# Patient Record
Sex: Female | Born: 1955 | Race: Black or African American | Hispanic: No | Smoking: Current every day smoker
Health system: Southern US, Community
[De-identification: ages and names within clinical notes are randomized; demographics above are authoritative.]

## PROBLEM LIST (undated history)

## (undated) DIAGNOSIS — H269 Unspecified cataract: Secondary | ICD-10-CM

## (undated) DIAGNOSIS — K219 Gastro-esophageal reflux disease without esophagitis: Secondary | ICD-10-CM

## (undated) DIAGNOSIS — E785 Hyperlipidemia, unspecified: Secondary | ICD-10-CM

## (undated) HISTORY — PX: CHOLECYSTECTOMY: SHX55

## (undated) HISTORY — PX: TUBAL LIGATION: SHX77

## (undated) HISTORY — PX: EYE SURGERY: SHX253

## (undated) HISTORY — DX: Unspecified cataract: H26.9

## (undated) HISTORY — DX: Hyperlipidemia, unspecified: E78.5

## (undated) HISTORY — DX: Gastro-esophageal reflux disease without esophagitis: K21.9

---

## 1998-05-13 ENCOUNTER — Encounter: Payer: Self-pay | Admitting: Emergency Medicine

## 1998-05-13 ENCOUNTER — Encounter: Payer: Self-pay | Admitting: Gastroenterology

## 1998-05-13 ENCOUNTER — Emergency Department (HOSPITAL_COMMUNITY): Admission: EM | Admit: 1998-05-13 | Discharge: 1998-05-13 | Payer: Self-pay | Admitting: Emergency Medicine

## 1998-05-14 ENCOUNTER — Ambulatory Visit (HOSPITAL_COMMUNITY): Admission: RE | Admit: 1998-05-14 | Discharge: 1998-05-14 | Payer: Self-pay | Admitting: Gastroenterology

## 1998-05-14 ENCOUNTER — Encounter (HOSPITAL_BASED_OUTPATIENT_CLINIC_OR_DEPARTMENT_OTHER): Payer: Self-pay | Admitting: General Surgery

## 1998-05-15 ENCOUNTER — Ambulatory Visit (HOSPITAL_COMMUNITY): Admission: RE | Admit: 1998-05-15 | Discharge: 1998-05-16 | Payer: Self-pay | Admitting: General Surgery

## 1998-05-27 ENCOUNTER — Encounter (HOSPITAL_BASED_OUTPATIENT_CLINIC_OR_DEPARTMENT_OTHER): Payer: Self-pay | Admitting: General Surgery

## 1998-05-27 ENCOUNTER — Emergency Department (HOSPITAL_COMMUNITY): Admission: EM | Admit: 1998-05-27 | Discharge: 1998-05-27 | Payer: Self-pay | Admitting: Emergency Medicine

## 2020-09-16 NOTE — Progress Notes (Addendum)
Subjective:    Rebecca Mata - 65 y.o. female MRN [id number]  Date of birth: 1956/01/25  HPI  Rebecca Mata is to establish care and annual physical exam.   Current issues and/or concerns: 1. SMOKING CESSATION: Smoking Status: current smoker Smoking Amount: currently 10 cigarettes daily Smoking Onset: since 65 years old Smoking Quit Date: Ready to quit. In the past she quit for 6 months abruptly.   Smoking triggers: after eating meal Treatments attempted: Chantix and made her have nightmares  2. DARKENED CIRCLES AROUND EYES: Reports ongoing for several years. Endorses itching and rubbing eyes. Using over-the-counter allergy medications as needed. Vision changing and seeing floaters sometimes. Denies trauma and injury.  3. BUMP ON ABDOMEN: Present for several years. Has not significantly changed in appearance. Denies tenderness, pain, and drainage.Thinks this may be a blackhead. Reports appeared shortly after having gallbladder removed.     ROS per HPI   Health Maintenance:  Health Maintenance Due  Topic Date Due  . COVID-19 Vaccine (1) Never done  . Hepatitis C Screening  Never done  . TETANUS/TDAP  Never done  . PAP SMEAR-Modifier  Never done  . COLONOSCOPY (Pts 45-38yrs Insurance coverage will need to be confirmed)  Never done  . MAMMOGRAM  Never done  . DEXA SCAN  Never done  . PNA vac Low Risk Adult (1 of 2 - PCV13) Never done    Past Medical History: There are no problems to display for this patient.   Social History   reports that she has been smoking cigarettes. She has a 21.00 pack-year smoking history. She has never used smokeless tobacco. She reports previous alcohol use. She reports that she does not use drugs.   Family History  family history includes Brain cancer in her father; Diabetes in her sister; Heart disease in her mother; Stroke in her mother.   Medications: reviewed and updated   Objective:   Physical Exam BP 135/83 (BP Location: Left  Arm, Patient Position: Sitting, Cuff Size: Normal)   Pulse 91   Temp 97.7 F (36.5 C)   Resp 15   Ht 5' 7.48" (1.714 m)   Wt 236 lb 3.2 oz (107.1 kg)   SpO2 96%   BMI 36.47 kg/m  Physical Exam HENT:     Head: Normocephalic and atraumatic.     Right Ear: Tympanic membrane, ear canal and external ear normal.     Left Ear: Tympanic membrane, ear canal and external ear normal.     Nose: Nose normal.     Mouth/Throat:     Mouth: Mucous membranes are moist.     Pharynx: Oropharynx is clear.  Eyes:     Conjunctiva/sclera: Conjunctivae normal.     Pupils: Pupils are equal, round, and reactive to light.  Cardiovascular:     Rate and Rhythm: Normal rate and regular rhythm.     Pulses: Normal pulses.     Heart sounds: Normal heart sounds.  Pulmonary:     Effort: Pulmonary effort is normal.     Breath sounds: Normal breath sounds.  Chest:     Comments: Patient declined examination. Abdominal:     General: Bowel sounds are normal.     Palpations: Abdomen is soft.     Comments: Firm movable non-tender nodule mid-left upper abdomen.  Genitourinary:    Comments: Patient declined examination. Musculoskeletal:        General: Normal range of motion.     Cervical back: Normal range of motion and  neck supple.  Skin:    General: Skin is warm and dry.     Capillary Refill: Capillary refill takes less than 2 seconds.     Comments: Hyperpigmented circles around bilateral eyes.  Neurological:     General: No focal deficit present.     Mental Status: She is alert and oriented to person, place, and time.  Psychiatric:        Mood and Affect: Mood normal.        Behavior: Behavior normal.     Assessment & Plan:  1. Encounter to establish care: - Patient presents today to establish care.   2. Annual physical exam: - Counseled on 150 minutes of exercise per week as tolerated, healthy eating (including decreased daily intake of saturated fats, cholesterol, added sugars, sodium), STI  prevention, and routine healthcare maintenance.  3. Screening for metabolic disorder: - CMP to check kidney function, liver function, and electrolyte balance.  - Comprehensive metabolic panel  4. Screening for deficiency anemia: - CBC to screen for anemia. - CBC  5. Diabetes mellitus screening: 6. Prediabetes: - Hemoglobin A1c today 6.1% and consistent with prediabetes. Next hemoglobin A1c due November 2022.  - Discussed the importance of healthy eating habits, low-carbohydrate diet, low-sugar diet, and regular aerobic exercise (at least 150 minutes a week as tolerated).  - Follow-up with primary provider in 6 months or sooner if needed.  - POCT glycosylated hemoglobin (Hb A1C)  7. Screening cholesterol level: - Lipid panel to screen for high cholesterol.  - Lipid panel  8. Thyroid disorder screen: - TSH to check thyroid function.  - TSH+T4F+T3Free  9. Need for hepatitis C screening test: - Hepatitis C antibody to screen for hepatitis C.  - Hepatitis C Antibody  10. Encounter for screening for HIV: - HIV antibody to screen for human immunodeficiency virus.  - HIV antibody (with reflex)  11. Encounter for screening mammogram for malignant neoplasm of breast: - Referral for breast cancer screening by mammogram.  - MM Digital Screening; Future  12. Pap smear for cervical cancer screening: - Referral to Gynecology for cervical cancer screening by PAP smear.  - Ambulatory referral to Gynecology  13. Colon cancer screening: - Declined colonoscopy.  - Patient prefers Cologuard screening.   14. Osteoporosis screening: - Declined DEXA scan for now.  - Counseled to notify provider when she ready to proceed. Patient agreeable.   15. Encounter for vitamin deficiency screening: - Vitamin D, 25-hydroxy to screen for vitamin D deficiency. - Vitamin D, 25-hydroxy  16. Encounter for smoking cessation counseling: - Tried Chantix in the past and caused nightmares. - Begin  Buproprion as prescribed. Counseled on possible side effects and to notify provider should any occur.  - Follow-up with primary provider in 4 weeks or sooner if needed.  - buPROPion (WELLBUTRIN SR) 150 MG 12 hr tablet; Take 1 tablet (150 mg total) by mouth daily for 3 days, THEN 1 tablet (150 mg total) 2 (two) times daily for 27 days.  Dispense: 57 tablet; Refill: 0  17. Nodule of skin of abdomen: - Chronic and stable. - Counseled to notify provider should new or worsening symptoms occur so that she may be referred at that time. Patient agreeable.   18. Eye exam, routine: - Referral to Ophthalmology for routine eye examination.  - Ambulatory referral to Ophthalmology   Patient was given clear instructions to go to Emergency Department or return to medical center if symptoms don't improve, worsen, or new problems develop.The patient  verbalized understanding.  I discussed the assessment and treatment plan with the patient. The patient was provided an opportunity to ask questions and all were answered. The patient agreed with the plan and demonstrated an understanding of the instructions.   The patient was advised to call back or seek an in-person evaluation if the symptoms worsen or if the condition fails to improve as anticipated.    Ricky Stabs, NP 09/18/2020, 10:37 AM Primary Care at [facility]

## 2020-09-18 ENCOUNTER — Encounter: Payer: Self-pay | Admitting: Family

## 2020-09-18 ENCOUNTER — Ambulatory Visit (INDEPENDENT_AMBULATORY_CARE_PROVIDER_SITE_OTHER): Payer: PPO | Admitting: Family

## 2020-09-18 ENCOUNTER — Other Ambulatory Visit: Payer: Self-pay

## 2020-09-18 VITALS — BP 135/83 | HR 91 | Temp 97.7°F | Resp 15 | Ht 67.48 in | Wt 236.2 lb

## 2020-09-18 DIAGNOSIS — Z114 Encounter for screening for human immunodeficiency virus [HIV]: Secondary | ICD-10-CM | POA: Diagnosis not present

## 2020-09-18 DIAGNOSIS — Z1321 Encounter for screening for nutritional disorder: Secondary | ICD-10-CM

## 2020-09-18 DIAGNOSIS — Z716 Tobacco abuse counseling: Secondary | ICD-10-CM | POA: Diagnosis not present

## 2020-09-18 DIAGNOSIS — R7303 Prediabetes: Secondary | ICD-10-CM | POA: Insufficient documentation

## 2020-09-18 DIAGNOSIS — Z0001 Encounter for general adult medical examination with abnormal findings: Secondary | ICD-10-CM | POA: Diagnosis not present

## 2020-09-18 DIAGNOSIS — Z1329 Encounter for screening for other suspected endocrine disorder: Secondary | ICD-10-CM

## 2020-09-18 DIAGNOSIS — Z7689 Persons encountering health services in other specified circumstances: Secondary | ICD-10-CM

## 2020-09-18 DIAGNOSIS — Z1159 Encounter for screening for other viral diseases: Secondary | ICD-10-CM | POA: Diagnosis not present

## 2020-09-18 DIAGNOSIS — Z0000 Encounter for general adult medical examination without abnormal findings: Secondary | ICD-10-CM

## 2020-09-18 DIAGNOSIS — Z1211 Encounter for screening for malignant neoplasm of colon: Secondary | ICD-10-CM

## 2020-09-18 DIAGNOSIS — Z13228 Encounter for screening for other metabolic disorders: Secondary | ICD-10-CM | POA: Diagnosis not present

## 2020-09-18 DIAGNOSIS — Z13220 Encounter for screening for lipoid disorders: Secondary | ICD-10-CM

## 2020-09-18 DIAGNOSIS — Z0100 Encounter for examination of eyes and vision without abnormal findings: Secondary | ICD-10-CM

## 2020-09-18 DIAGNOSIS — Z130 Encounter for screening for diseases of the blood and blood-forming organs and certain disorders involving the immune mechanism: Secondary | ICD-10-CM

## 2020-09-18 DIAGNOSIS — R222 Localized swelling, mass and lump, trunk: Secondary | ICD-10-CM

## 2020-09-18 DIAGNOSIS — Z124 Encounter for screening for malignant neoplasm of cervix: Secondary | ICD-10-CM

## 2020-09-18 DIAGNOSIS — Z1231 Encounter for screening mammogram for malignant neoplasm of breast: Secondary | ICD-10-CM

## 2020-09-18 DIAGNOSIS — Z131 Encounter for screening for diabetes mellitus: Secondary | ICD-10-CM

## 2020-09-18 DIAGNOSIS — Z13820 Encounter for screening for osteoporosis: Secondary | ICD-10-CM

## 2020-09-18 LAB — POCT GLYCOSYLATED HEMOGLOBIN (HGB A1C): Hemoglobin A1C: 6.1 % — AB (ref 4.0–5.6)

## 2020-09-18 MED ORDER — BUPROPION HCL ER (SR) 150 MG PO TB12: ORAL_TABLET | ORAL | 0 refills | Status: DC

## 2020-09-18 NOTE — Patient Instructions (Signed)
- Return for annual physical examination, labs, and health maintenance. Arrive fasting meaning having no food for at least 8 hours prior to appointment. You may have only water or black coffee. Please take scheduled medications as normal. Thank you for choosing Primary Care at [facility] for your medical home!    Rebecca Mata was seen by Rema Fendt, NP today.   Rebecca Mata's primary care provider is Rema Fendt, NP.   For the best care possible,  you should try to see Ricky Stabs, NP whenever you come to clinic.   We look forward to seeing you again soon!  If you have any questions about your visit today,  please call us at [phone]  Or feel free to reach your provider via MyChart.    Keeping you healthy   Get these tests  Blood pressure- Have your blood pressure checked once a year by your healthcare provider.  Normal blood pressure is 120/80.  Weight- Have your body mass index (BMI) calculated to screen for obesity.  BMI is a measure of body fat based on height and weight. You can also calculate your own BMI at https://www.west-esparza.com/.  Cholesterol- Have your cholesterol checked regularly starting at age 48, sooner may be necessary if you have diabetes, high blood pressure, if a family member developed heart diseases at an early age or if you smoke.   Chlamydia, HIV, and other sexual transmitted disease- Get screened each year until the age of 39 then within three months of each new sexual partner.  Diabetes- Have your blood sugar checked regularly if you have high blood pressure, high cholesterol, a family history of diabetes or if you are overweight.   Get these vaccines  Flu shot- Every fall.  Tetanus shot- Every 10 years.  Menactra- Single dose; prevents meningitis.   Take these steps  Don't smoke- If you do smoke, ask your healthcare provider about quitting. For tips on how to quit, go to www.smokefree.gov or call 1-800-QUIT-NOW.  Be  physically active- Exercise 5 days a week for at least 30 minutes.  If you are not already physically active start slow and gradually work up to 30 minutes of moderate physical activity.  Examples of moderate activity include walking briskly, mowing the yard, dancing, swimming bicycling, etc.  Eat a healthy diet- Eat a variety of healthy foods such as fruits, vegetables, low fat milk, low fat cheese, yogurt, lean meats, poultry, fish, beans, tofu, etc.  For more information on healthy eating, go to www.thenutritionsource.org  Drink alcohol in moderation- Limit alcohol intake two drinks or less a day.  Never drink and drive.  Dentist- Brush and floss teeth twice daily; visit your dentis twice a year.  Depression-Your emotional health is as important as your physical health.  If you're feeling down, losing interest in things you normally enjoy please talk with your healthcare provider.  Gun Safety- If you keep a gun in your home, keep it unloaded and with the safety lock on.  Bullets should be stored separately.  Helmet use- Always wear a helmet when riding a motorcycle, bicycle, rollerblading or skateboarding.  Safe sex- If you may be exposed to a sexually transmitted infection, use a condom  Seat belts- Seat bels can save your life; always wear one.  Smoke/Carbon Monoxide detectors- These detectors need to be installed on the appropriate level of your home.  Replace batteries at least once a year.  Skin Cancer- When out in the sun, cover up  and use sunscreen SPF 15 or higher.  Violence- If anyone is threatening or hurting you, please tell your healthcare provider.

## 2020-09-18 NOTE — Progress Notes (Signed)
Establish care Has not been seen by PCP for over 15-16 years

## 2020-09-19 LAB — COMPREHENSIVE METABOLIC PANEL
ALT: 19 IU/L (ref 0–32)
AST: 42 IU/L — ABNORMAL HIGH (ref 0–40)
Albumin/Globulin Ratio: 1.4 (ref 1.2–2.2)
Albumin: 4.6 g/dL (ref 3.8–4.8)
Alkaline Phosphatase: 78 IU/L (ref 44–121)
BUN/Creatinine Ratio: 17 (ref 12–28)
BUN: 16 mg/dL (ref 8–27)
Bilirubin Total: 0.4 mg/dL (ref 0.0–1.2)
CO2: 21 mmol/L (ref 20–29)
Calcium: 9.8 mg/dL (ref 8.7–10.3)
Chloride: 100 mmol/L (ref 96–106)
Creatinine, Ser: 0.92 mg/dL (ref 0.57–1.00)
Globulin, Total: 3.4 g/dL (ref 1.5–4.5)
Glucose: 91 mg/dL (ref 65–99)
Potassium: 4.6 mmol/L (ref 3.5–5.2)
Sodium: 139 mmol/L (ref 134–144)
Total Protein: 8 g/dL (ref 6.0–8.5)
eGFR: 69 mL/min/{1.73_m2} (ref >59)

## 2020-09-19 LAB — LIPID PANEL
Chol/HDL Ratio: 5.9 ratio — ABNORMAL HIGH (ref 0.0–4.4)
Cholesterol, Total: 218 mg/dL — ABNORMAL HIGH (ref 100–199)
HDL: 37 mg/dL — ABNORMAL LOW (ref >39)
LDL Chol Calc (NIH): 154 mg/dL — ABNORMAL HIGH (ref 0–99)
Triglycerides: 147 mg/dL (ref 0–149)
VLDL Cholesterol Cal: 27 mg/dL (ref 5–40)

## 2020-09-19 LAB — HIV ANTIBODY (ROUTINE TESTING W REFLEX): HIV Screen 4th Generation wRfx: NONREACTIVE

## 2020-09-19 LAB — CBC
Hematocrit: 42.2 % (ref 34.0–46.6)
Hemoglobin: 13.2 g/dL (ref 11.1–15.9)
MCH: 27.2 pg (ref 26.6–33.0)
MCHC: 31.3 g/dL — ABNORMAL LOW (ref 31.5–35.7)
MCV: 87 fL (ref 79–97)
Platelets: 312 10*3/uL (ref 150–450)
RBC: 4.85 x10E6/uL (ref 3.77–5.28)
RDW: 15 % (ref 11.7–15.4)
WBC: 7 10*3/uL (ref 3.4–10.8)

## 2020-09-19 LAB — TSH+T4F+T3FREE
Free T4: 1.2 ng/dL (ref 0.82–1.77)
T3, Free: 3.1 pg/mL (ref 2.0–4.4)
TSH: 0.763 u[IU]/mL (ref 0.450–4.500)

## 2020-09-19 LAB — VITAMIN D 25 HYDROXY (VIT D DEFICIENCY, FRACTURES): Vit D, 25-Hydroxy: 11.6 ng/mL — ABNORMAL LOW (ref 30.0–100.0)

## 2020-09-19 LAB — HEPATITIS C ANTIBODY: Hep C Virus Ab: <0.1 s/co ratio (ref 0.0–0.9)

## 2020-09-20 ENCOUNTER — Other Ambulatory Visit: Payer: Self-pay | Admitting: Family

## 2020-09-20 ENCOUNTER — Encounter: Payer: Self-pay | Admitting: Family

## 2020-09-20 DIAGNOSIS — E785 Hyperlipidemia, unspecified: Secondary | ICD-10-CM

## 2020-09-20 DIAGNOSIS — E559 Vitamin D deficiency, unspecified: Secondary | ICD-10-CM | POA: Insufficient documentation

## 2020-09-20 MED ORDER — ATORVASTATIN CALCIUM 40 MG PO TABS: 40.0000 mg | ORAL_TABLET | Freq: Every day | ORAL | 0 refills | Status: AC

## 2020-09-20 MED ORDER — CHOLECALCIFEROL 1.25 MG (50000 UT) PO TABS: 1.0000 | ORAL_TABLET | ORAL | 0 refills | Status: AC

## 2020-09-20 NOTE — Progress Notes (Signed)
Kidney function normal.   Thyroid function normal.  Hepatitis C negative.   HIV negative.  AST elevated. This is used to check liver function.   Some causes of an elevation of AST may be increased alcohol consumption, high-fat diet, or overuse of NSAID's such as Ibuprofen, Aleve, Motrin, and Naproxen. Patient encouraged to have rechecked in 4 weeks for lab only appointment.  CBC to screen for anemia essentially normal. Reminder to incorporate  iron-rich foods such as dark green leafy vegetables and dried fruit such as raisins and apricots in diet.  Vitamin D level low. Cholecalciferol (Vitamin D3) prescribed. Take 1 tablet every 7 days. Patient encouraged to have rechecked in 12 weeks.  Hemoglobin A1c is consistent with pre-diabetes. Practice healthy eating habits of fresh fruit and vegetables, lean baked meats such as chicken, fish, and [place]; limit breads, rice, pastas, and desserts; practice regular aerobic exercise (at least 150 minutes a week as tolerated). Patient encouraged to have rechecked in 3 to 6 months.  Cholesterol higher than expected. High cholesterol may increase risk of heart attack and/or stroke. Consider eating more fruits, vegetables, and lean baked meats such as chicken or fish. Moderate intensity exercise at least 150 minutes as tolerated per week may help as well.   Begin Atorvastatin 1 tablet daily for high cholesterol. Patient encouraged to have rechecked in 12 weeks.  The following is for provider reference only: The 10-year ASCVD risk score is: 20%   Values used to calculate the score:     Age: 65 years     Sex: Female     Is Non-Hispanic African American: Yes     Diabetic: No     Tobacco smoker: Yes     Systolic Blood Pressure: 135 mmHg     Is BP treated: No     HDL Cholesterol: 37 mg/dL     Total Cholesterol: 218 mg/dL

## 2020-10-03 DIAGNOSIS — Z1212 Encounter for screening for malignant neoplasm of rectum: Secondary | ICD-10-CM | POA: Diagnosis not present

## 2020-10-03 DIAGNOSIS — Z1211 Encounter for screening for malignant neoplasm of colon: Secondary | ICD-10-CM | POA: Diagnosis not present

## 2020-10-05 LAB — COLOGUARD: Cologuard: POSITIVE — AB

## 2020-10-12 ENCOUNTER — Other Ambulatory Visit: Payer: Self-pay | Admitting: Family

## 2020-10-12 DIAGNOSIS — Z716 Tobacco abuse counseling: Secondary | ICD-10-CM

## 2020-10-14 ENCOUNTER — Encounter: Payer: Self-pay | Admitting: Family

## 2020-10-14 NOTE — Progress Notes (Unsigned)
[id number] 

## 2020-10-15 NOTE — Progress Notes (Signed)
Patient ID: Rebecca Mata, female    DOB: 02/03/1956  MRN: [id number]  CC: Smoking Cessation Follow-Up  Subjective: Rebecca Mata is a 65 y.o. female who presents for smoking cessation follow-up.   Her concerns today include:   SMOKING CESSATION FOLLOW-UP: 09/18/2020: - Tried Chantix in the past and caused nightmares. - Begin Buproprion as prescribed. Counseled on possible side effects and to notify provider should any occur.  - Follow-up with primary provider in 4 weeks or sooner if needed.   10/16/2020: Doing well on current regimen. Down to 2 or 3 cigarettes daily. Most times 1 cigarette can last 2 days. Reports having a distaste for cigarettes sometimes when smoking.   2. COLOGUARD FOLLOW-UP: Has concerns about positive Cologuard screening.   3. OSTEOPOROSIS SCREENING: Reports she is ready to proceed with DEXA SCAN.    Patient Active Problem List   Diagnosis Date Noted   Vitamin D deficiency 09/20/2020   Hyperlipidemia 09/20/2020   Prediabetes 09/18/2020     Current Outpatient Medications on File Prior to Visit  Medication Sig Dispense Refill   atorvastatin (LIPITOR) 40 MG tablet Take 1 tablet (40 mg total) by mouth daily. 90 tablet 0   Cholecalciferol (VITAMIN D3) 125 MCG (5000 UT) CAPS TAKE 1 TABLET BY MOUTH EVERY 7 (SEVEN) DAYS FOR 12 DOSES.     Cholecalciferol 1.25 MG (50000 UT) TABS Take 1 tablet by mouth every 7 (seven) days for 12 doses. 12 tablet 0   No current facility-administered medications on file prior to visit.    No Known Allergies  Social History   Socioeconomic History   Marital status: Widowed    Spouse name: Not on file   Number of children: Not on file   Years of education: Not on file   Highest education level: Not on file  Occupational History   Not on file  Tobacco Use   Smoking status: Every Day    Packs/day: 0.50    Years: 42.00    Pack years: 21.00    Types: Cigarettes   Smokeless tobacco: Never  Vaping Use   Vaping Use:  Never used  Substance and Sexual Activity   Alcohol use: Not Currently    Alcohol/week: 0.0 standard drinks   Drug use: Never   Sexual activity: Not Currently    Birth control/protection: Post-menopausal  Other Topics Concern   Not on file  Social History Narrative   Not on file   Social Determinants of Health   Financial Resource Strain: Not on file  Food Insecurity: Not on file  Transportation Needs: Not on file  Physical Activity: Not on file  Stress: Not on file  Social Connections: Not on file  Intimate Partner Violence: Not on file    Family History  Problem Relation Age of Onset   Heart disease Mother    Stroke Mother    Brain cancer Father    Diabetes Sister     Past Surgical History:  Procedure Laterality Date   CHOLECYSTECTOMY     TUBAL LIGATION      ROS: Review of Systems Negative except as stated above  PHYSICAL EXAM: BP 139/74 (BP Location: Left Arm, Patient Position: Sitting, Cuff Size: Normal)   Pulse 71   Temp 98.1 F (36.7 C)   Resp 15   Ht 5' 7.48" (1.714 m)   Wt 229 lb 9.6 oz (104.1 kg)   SpO2 98%   BMI 35.45 kg/m   Wt Readings from Last 3 Encounters:  10/16/20 229 lb 9.6 oz (104.1 kg)  09/18/20 236 lb 3.2 oz (107.1 kg)    Physical Exam General appearance - alert, well appearing, and in no distress and oriented to person, place, and time Mental status - alert, oriented to person, place, and time, normal mood, behavior, speech, dress, motor activity, and thought processes Neck - supple, no significant adenopathy Chest - clear to auscultation, no wheezes, rales or rhonchi, symmetric air entry, no tachypnea, retractions or cyanosis Heart - normal rate, regular rhythm, normal S1, S2, no murmurs, rubs, clicks or gallops Neurological - alert, oriented, normal speech, no focal findings or movement disorder noted  ASSESSMENT AND PLAN: 1. Encounter for smoking cessation counseling: - Continue Bupropion as prescribed.  - Follow-up with  primary provider in 3 months or sooner if needed.  - buPROPion (WELLBUTRIN SR) 150 MG 12 hr tablet; Take 1 tablet (150 mg total) by mouth twice (2 times) daily.  Dispense: 60 tablet; Refill: 2  2. Colon cancer screening: - Cologuard positive on 10/05/2020. - Referral to Gastroenterology for further evaluation and management.  - Ambulatory referral to Gastroenterology  3. Osteoporosis screening: - DG Bone Density to screen for osteoporosis.  - DG Bone Density; Future   Patient was given the opportunity to ask questions.  Patient verbalized understanding of the plan and was able to repeat key elements of the plan. Patient was given clear instructions to go to Emergency Department or return to medical center if symptoms don't improve, worsen, or new problems develop.The patient verbalized understanding.   Orders Placed This Encounter  Procedures   DG Bone Density   Ambulatory referral to Gastroenterology    Requested Prescriptions   Signed Prescriptions Disp Refills   buPROPion (WELLBUTRIN SR) 150 MG 12 hr tablet 60 tablet 2    Sig: Take 1 tablet (150 mg total) by mouth twice (2 times) daily.   Follow-up with primary provider in 3 months or sooner if needed.    Rema Fendt, NP

## 2020-10-16 ENCOUNTER — Encounter: Payer: Self-pay | Admitting: Family

## 2020-10-16 ENCOUNTER — Ambulatory Visit (INDEPENDENT_AMBULATORY_CARE_PROVIDER_SITE_OTHER): Payer: PPO | Admitting: Family

## 2020-10-16 ENCOUNTER — Other Ambulatory Visit: Payer: Self-pay

## 2020-10-16 VITALS — BP 139/74 | HR 71 | Temp 98.1°F | Resp 15 | Ht 67.48 in | Wt 229.6 lb

## 2020-10-16 DIAGNOSIS — Z1211 Encounter for screening for malignant neoplasm of colon: Secondary | ICD-10-CM

## 2020-10-16 DIAGNOSIS — Z13820 Encounter for screening for osteoporosis: Secondary | ICD-10-CM | POA: Diagnosis not present

## 2020-10-16 DIAGNOSIS — Z716 Tobacco abuse counseling: Secondary | ICD-10-CM

## 2020-10-16 MED ORDER — BUPROPION HCL ER (SR) 150 MG PO TB12: ORAL_TABLET | ORAL | 2 refills | Status: DC

## 2020-10-16 NOTE — Progress Notes (Signed)
Pt presents for smoking cessation f/u Pt req referral for colonoscopy, positive for Cologuard

## 2020-10-16 NOTE — Patient Instructions (Signed)
Colonoscopy, Adult A colonoscopy is a procedure to look at the entire large intestine. This procedure is done using a long, thin, flexible tube that has a camera on theend. You may have a colonoscopy: As a part of normal colorectal screening. If you have certain symptoms, such as: A low number of red blood cells in your blood (anemia). Diarrhea that does not go away. Pain in your abdomen. Blood in your stool. A colonoscopy can help screen for and diagnose medical problems, including: Tumors. Extra tissue that grows where mucus forms (polyps). Inflammation. Areas of bleeding. Tell your health care provider about: Any allergies you have. All medicines you are taking, including vitamins, herbs, eye drops, creams, and over-the-counter medicines. Any problems you or family members have had with anesthetic medicines. Any blood disorders you have. Any surgeries you have had. Any medical conditions you have. Any problems you have had with having bowel movements. Whether you are pregnant or may be pregnant. What are the risks? Generally, this is a safe procedure. However, problems may occur, including: Bleeding. Damage to your intestine. Allergic reactions to medicines given during the procedure. Infection. This is rare. What happens before the procedure? Eating and drinking restrictions Follow instructions from your health care provider about eating or drinking restrictions, which may include: A few days before the procedure: Follow a low-fiber diet. Avoid nuts, seeds, dried fruit, raw fruits, and vegetables. 1-3 days before the procedure: Eat only gelatin dessert or ice pops. Drink only clear liquids, such as water, clear juice, clear broth or bouillon, black coffee or tea, or clear soft drinks or sports drinks. Avoid liquids that contain red or purple dye. The day of the procedure: Do not eat solid foods. You may continue to drink clear liquids until up to 2 hours before the  procedure. Do not eat or drink anything starting 2 hours before the procedure, or within the time period that your health care provider recommends. Bowel prep If you were prescribed a bowel prep to take by mouth (orally) to clean out your colon: Take it as told by your health care provider. Starting the day before your procedure, you will need to drink a large amount of liquid medicine. The liquid will cause you to have many bowel movements of loose stool until your stool becomes almost clear or light green. If your skin or the opening between the buttocks (anus) gets irritated from diarrhea, you may relieve the irritation using: Wipes with medicine in them, such as adult wet wipes with aloe and vitamin E. A product to soothe skin, such as petroleum jelly. If you vomit while drinking the bowel prep: Take a break for up to 60 minutes. Begin the bowel prep again. Call your health care provider if you keep vomiting or you cannot take the bowel prep without vomiting. To clean out your colon, you may also be given: Laxative medicines. These help you have a bowel movement. Instructions for enema use. An enema is liquid medicine injected into your rectum. Medicines Ask your health care provider about: Changing or stopping your regular medicines or supplements. This is especially important if you are taking iron supplements, diabetes medicines, or blood thinners. Taking medicines such as aspirin and ibuprofen. These medicines can thin your blood. Do not take these medicines unless your health care provider tells you to take them. Taking over-the-counter medicines, vitamins, herbs, and supplements. General instructions Ask your health care provider what steps will be taken to help prevent infection. These may include washing   skin with a germ-killing soap. Plan to have someone take you home from the hospital or clinic. What happens during the procedure?  An IV will be inserted into one of your  veins. You may be given one or more of the following: A medicine to help you relax (sedative). A medicine to numb the area (local anesthetic). A medicine to make you fall asleep (general anesthetic). This is rarely needed. You will lie on your side with your knees bent. The tube will: Have oil or gel put on it (be lubricated). Be inserted into your anus. Be gently eased through all parts of your large intestine. Air will be sent into your colon to keep it open. This may cause some pressure or cramping. Images will be taken with the camera and will appear on a screen. A small tissue sample may be removed to be looked at under a microscope (biopsy). The tissue may be sent to a lab for testing if any signs of problems are found. If small polyps are found, they may be removed and checked for cancer cells. When the procedure is finished, the tube will be removed. The procedure may vary among health care providers and hospitals. What happens after the procedure? Your blood pressure, heart rate, breathing rate, and blood oxygen level will be monitored until you leave the hospital or clinic. You may have a small amount of blood in your stool. You may pass gas and have mild cramping or bloating in your abdomen. This is caused by the air that was used to open your colon during the exam. Do not drive for 24 hours after the procedure. It is up to you to get the results of your procedure. Ask your health care provider, or the department that is doing the procedure, when your results will be ready. Summary A colonoscopy is a procedure to look at the entire large intestine. Follow instructions from your health care provider about eating and drinking before the procedure. If you were prescribed an oral bowel prep to clean out your colon, take it as told by your health care provider. During the colonoscopy, a flexible tube with a camera on its end is inserted into the anus and then passed into the other  parts of the large intestine. This information is not intended to replace advice given to you by your health care provider. Make sure you discuss any questions you have with your healthcare provider. Document Revised: 11/16/2018 Document Reviewed: 11/16/2018 Elsevier Patient Education  2022 [employer]  

## 2020-11-05 ENCOUNTER — Other Ambulatory Visit: Payer: Self-pay

## 2020-11-05 ENCOUNTER — Ambulatory Visit
Admission: RE | Admit: 2020-11-05 | Discharge: 2020-11-05 | Disposition: A | Discharge status: Home / Self Care | Payer: PPO | Source: Ambulatory Visit | Attending: Family | Admitting: Family

## 2020-11-05 DIAGNOSIS — Z780 Asymptomatic menopausal state: Secondary | ICD-10-CM | POA: Diagnosis not present

## 2020-11-05 DIAGNOSIS — Z13820 Encounter for screening for osteoporosis: Secondary | ICD-10-CM

## 2020-11-06 NOTE — Progress Notes (Signed)
No osteoporosis.

## 2020-11-06 NOTE — Progress Notes (Signed)
Repeat bone density in 1 to 2 years.

## 2020-11-20 DIAGNOSIS — H40033 Anatomical narrow angle, bilateral: Secondary | ICD-10-CM | POA: Diagnosis not present

## 2020-11-20 DIAGNOSIS — H2511 Age-related nuclear cataract, right eye: Secondary | ICD-10-CM | POA: Diagnosis not present

## 2020-11-20 DIAGNOSIS — R7309 Other abnormal glucose: Secondary | ICD-10-CM | POA: Diagnosis not present

## 2020-11-26 ENCOUNTER — Ambulatory Visit
Admission: RE | Admit: 2020-11-26 | Discharge: 2020-11-26 | Disposition: A | Discharge status: Home / Self Care | Payer: PPO | Source: Ambulatory Visit | Attending: Family | Admitting: Family

## 2020-11-26 ENCOUNTER — Other Ambulatory Visit: Payer: Self-pay

## 2020-11-26 DIAGNOSIS — Z1231 Encounter for screening mammogram for malignant neoplasm of breast: Secondary | ICD-10-CM | POA: Diagnosis not present

## 2020-11-30 NOTE — Progress Notes (Signed)
No evidence of malignancy. Repeat mammogram in 1 year.

## 2020-12-14 ENCOUNTER — Other Ambulatory Visit: Payer: Self-pay

## 2020-12-14 NOTE — Patient Outreach (Signed)
Aging Gracefully Program  12/14/2020  Rebecca Mata April 14, 1956 [id number]   [facility] Evaluation Interviewer made contact with patient. Aging Gracefully survey scheduled for 12/18/20 at  10 o'clock.   [facility] Care Management Assistant  [phone]

## 2020-12-18 ENCOUNTER — Other Ambulatory Visit: Payer: Self-pay

## 2020-12-18 NOTE — Patient Outreach (Signed)
Aging Gracefully Program  12/18/2020  Rebecca Mata December 30, 1955 [id number]  [facility] Evaluation Interviewer made contact with patient. Aging Gracefully survey completed.   Interviewer will send referral to [place] [facility] and OT for follow up.  [facility] Care Management Assistant  [phone]

## 2020-12-21 ENCOUNTER — Ambulatory Visit: Payer: PPO | Admitting: Family

## 2020-12-22 ENCOUNTER — Other Ambulatory Visit: Payer: Self-pay | Admitting: Family

## 2021-01-05 ENCOUNTER — Other Ambulatory Visit: Payer: Self-pay

## 2021-01-05 ENCOUNTER — Other Ambulatory Visit: Payer: Self-pay | Admitting: Occupational Therapy

## 2021-01-05 NOTE — Patient Instructions (Signed)
Goals:   Goals Addressed                       This Visit's Progress     Patient Stated            Feel safe at her front and back entrances to her house. Front entrance needs railing to be secured at steps and on porch. Back entrance needs a ramp so she can have a level entrance (no steps) to get into her house.       Patient Stated            She would like to feel more safe in her bathrooms. Hall bath needs a grab bar for shower, hand held shower, shower seat, and a comfort height toilet. Master bath needs grab bars in shower stall.

## 2021-01-05 NOTE — Patient Outreach (Signed)
Aging Gracefully Program  OT Initial Visit  01/05/2021  Rebecca Mata 1955-08-17 [id number]  Visit:  1- Initial Visit  Start Time:  1300 End Time:  1415 Total Minutes:  75  CCAP: Typical Daily Routine: Typical Daily Routine:: Gets up, does some tasks around the house, runs errands, goes to [employer] program 3-4 days a week. What Types Of Care Problems Are You Having Throughout The Day?: back pain and stiffness but has gotten somewhat better since she has lost 20 pounds with hoping to lose 20 more What Kind Of Help Do You Receive?: Grandson helps some around the house, she pays to have someone mow her yard Do You Think You Need Other Types Of Help?: Not physical help, but help with home repairs What Do You Think Would Make Everyday Life Easier For You?: fixing railing at front door, ramp at back door, grab bars in bathrooms, comfort height toilet in hall bathroom What Is A Good Day Like?: Less pain/stiffness What Is A Bad Day Like?: More pain/stiffness Do You Have Time For Yourself?: yes Patient Reported Equipment: Patient Reported Equipment Currently Used:  (folding chair in tub/shower and at bathroom sink, stool in kitchen) Functional Mobility-Stooping, Crouching, Kneeling To Retreive Item: Stooping, Crouching, or Kneeling To Retrieve Item: Moderate Difficulty Do You:: Use A Device (reacher) Functional Mobility-Climb 1 Flight Of Stairs: Climb 1 Flight Of Stairs: Moderate Difficulty (depending on rail or ramp v stairs) Do You:: No Device/No Assistance Importance Of Learning New Strategies:: Very Much Observation: Climb One Flight Of Stairs: Independent With Pain, Difficulty, Or Use Of Device Safety: Moderate/Extreme Risk Efficiency: Somewhat Intervention: Yes Other Comments:: railing fixed at front , ramp at back Functional Mobility-Move In And Out Of Bath/Shower: Move In And Out Of A Bath/Shower: A Little Difficulty Do You:: Use A Device Importance Of Learning New  Strategies:: Moderate Safety: Moderate/Extreme Risk Efficiency: Somewhat Intervention: Yes Other Comments:: grab bars and shower seat Functional Mobility-Get On And Off Toilet: Getting Up From The Floor: A Little Difficulty  Readiness To Change Score:  Readiness to Change Score: 10  Home Environment Assessment: Outside Home Entry:: Front door--metal rail up stairs and on her porch is loose. She does not have a level entry into her house at present (she had one many years ago--a ramp when she was taking care ofher father, but it rotted) Surveyor, mining:: Faucet leaks Bathroom:: Master bath--needs grab bars in shower (one on inside wall between seats and one on wall on outside to left. Hall bath--needs a grab bar on outside at front of tub (where faucets are), a hande held shower, a comfort height toilet, and faucet/water controls looked at for leaking. Laundry:: Had a leak, she cut out sheetrock to find it and sheetrock has never been put back due to is afraid it may happen again--wonders if the sheetrock can be put back but with an access door to the pipes Other Home Environment Concerns:: A light in the living room trips the power when she has it plugged in and turns it on, 2 outlets in kitchen don't work, window where she installed a cooling window unit is broken (broke while they were putting in window unit), she doesnt feel that window unit is set a correct angle due to it is causing mildew on the house, she runs out of hot water if she and grandson take showers back to back, mold at [place] door, moisture under the house, dryer vent disconnected under house, ceiling fan in her bedroom wobles, would  like ceiling fans in master bedroom, board missing at rear of house at pitch of roof, fascia board at several places on house needs repair, would like gutters put back up, would like roof looked at (30+ years), would like a divided screen door at back door   Goals:  Goals Addressed             This  Visit's Progress    Patient Stated       Feel safe at her front and back entrances to her house. Front entrance needs railing to be secured at steps and on porch. Back entrance needs a ramp so she can have a level entrance (no steps) to get into her house.     Patient Stated       She would like to feel more safe in her bathrooms. Hall bath needs a grab bar for shower, hand held shower, shower seat, and a comfort height toilet. Master bath needs grab bars in shower stall.        Post Clinical Reasoning: Clinician View Of Client Situation:: Rebecca Mata has been working towards losing weight since her family members have started having more health issues at the age she is now, She has lost 20 pounds so far 20 pounds so far and is working towards losing 20 more. She is a very independent lady who has been a single mom of 2 children since she was 66 years old. She does all of her basic ADLs and IADLs by herself with some assistance from her grandson (a Holiday representative at [employer]) that lives with her. There are several safety issues that need to be taken care of to make getting into her home more safe now and in the future, bathroom modifications to make them more safe, and other home concerns she has to be able to stay there for as long as she can. Client View Of His/Her Situation:: Rebecca Mata is very happy with the changes she has made to help her become more healthy and losing the 20 pounds she has lost so far. She takes good care of her house inside and out, trying to do as much as she can for herself over the years. She looks forward to what the Aging Gracefully program can offer her. Next Visit Plan:: Shower seat  [name], OTR/L Aging Gracefully [phone]

## 2021-01-08 ENCOUNTER — Other Ambulatory Visit: Payer: Self-pay

## 2021-01-08 NOTE — Patient Outreach (Signed)
Aging Gracefully Program  01/08/2021  Rebecca Mata [birth year] [id number]  Placed call to patient to schedule RN initial home visit. Offered appointment for 01/13/2021 at 1pm. Patient accepted. Confirmed address.  Rowe Pavy RN, BSN, Careers adviser for [employer] Mobile: [phone]

## 2021-01-13 ENCOUNTER — Other Ambulatory Visit: Payer: Self-pay

## 2021-01-13 NOTE — Patient Outreach (Signed)
Aging Gracefully Program  RN Visit  01/13/2021  Rebecca Mata January 11, 1956 [id number]  Visit:   RN initial Aging Gracefully home visit  Start Time:   1255 End Time:   1415 Total Minutes:   80  Readiness To Change Score:     Universal RN Interventions: Calendar Distribution: Yes Exercise Review: No Medications: Yes Medication Changes: No Mood: Yes Pain: Yes PCP Advocacy/Support: No Fall Prevention: Yes Incontinence: Yes Clinician View Of Client Situation: Covid screening negative. Patient  wearing mask. When I arrived patient was outside on porch. Home neat and clean. Nats or flying bugs noted. Patient is ambulatory without assistive devices. Knowledable about her health and has made life style changes to improve her health. She is currently not taking medications as she is waiting to see if her labs have improved with weight loss and exercise. Has lost 20 pounds. Client View Of His/Her Situation: Patient reports he main concern with her health is her eyes. Reports second concern is her prediabetes and her cholestrol.  patient reports she has changed her lifestyle is now healthier.  Reports pending eye surgery on 02/26/2021 and 03/12/2021.  Family will assist her 2 days after surgery.  Patient has chronic  back pain and she is able to manage by modifying how she does things that she needs to do. Like she will lay on the floor to clean her baseboards. She sits on a stool in the kitchen to Rebecca Mata.  Reports problems with constipation. She eats pecans for fiber and has increased her exercise. She has a positive outlook on life. Reports she does not drive at night or when it rains due to glares and she can not see.  Healthcare Provider Communication: Did Surveyor, mining With [employer] Provider?: No According to Client, Did PCP Report Communication With An Aging Gracefully RN?: No  Clinician View of Client Situation: Clinician View Of Client Situation: Covid screening  negative. Patient  wearing mask. When I arrived patient was outside on porch. Home neat and clean. Nats or flying bugs noted. Patient is ambulatory without assistive devices. Knowledable about her health and has made life style changes to improve her health. She is currently not taking medications as she is waiting to see if her labs have improved with weight loss and exercise. Has lost 20 pounds. Client's View of His/Her Situation: Client View Of His/Her Situation: Patient reports he main concern with her health is her eyes. Reports second concern is her prediabetes and her cholestrol.  patient reports she has changed her lifestyle is now healthier.  Reports pending eye surgery on 02/26/2021 and 03/12/2021.  Family will assist her 2 days after surgery.  Patient has chronic  back pain and she is able to manage by modifying how she does things that she needs to do. Like she will lay on the floor to clean her baseboards. She sits on a stool in the kitchen to Rebecca Mata.  Reports problems with constipation. She eats pecans for fiber and has increased her exercise. She has a positive outlook on life. Reports she does not drive at night or when it rains due to glares and she can not see.  Medication Assessment: Do You Have Any Problems Paying For Medications?: No Can Client Read Pill Bottles?: No Does Client Use A Pillbox?: No Does Anyone Assist Client In Taking Medications?: No Do You Take Vitamin D?: Yes Does Client Have Any Questions Or Concerns About Medictions?: No Is Client Complaining Of  Any Symptoms That Could Be Side Effects To Medications?: No Any Possible Changes In Medication Regimen?: No Outpatient Encounter Medications as of 01/13/2021  Medication Sig   Cholecalciferol (VITAMIN D3) 1.25 MG (50000 UT) TABS TAKE 1 TABLET BY MOUTH EVERY 7 DAYS FOR 12 DOSES   atorvastatin (LIPITOR) 40 MG tablet Take 1 tablet (40 mg total) by mouth daily. (Patient not taking: Reported on 01/13/2021)    buPROPion (WELLBUTRIN SR) 150 MG 12 hr tablet Take 1 tablet (150 mg total) by mouth twice (2 times) daily. (Patient not taking: Reported on 01/13/2021)   Cholecalciferol (VITAMIN D3) 125 MCG (5000 UT) CAPS TAKE 1 TABLET BY MOUTH EVERY 7 (SEVEN) DAYS FOR 12 DOSES.   No facility-administered encounter medications on file as of 01/13/2021.     OT Update: OT visit completed. Awaiting home inspection by [employer]. Email to OT and [employer] about flying bugs/nats coming from around air conditioner.  Session Summary: Very nice patient with neat and clean home.  Chronic back pain, New issues with eyes-pending surgery, prediabetes and elevated cholesterol.   Goals Addressed               This Visit's Progress     Patient Stated (pt-stated)        AG RN Nursing goals:  Patient will report decreased back pain in the next 4 months.  01/13/2021  Assessment: Reports chronic back pain. Uses heating pad and recliner for pain when needed.  Interventions: encouraged patient to continue to go to silver sneaker and be active. Reviewed OTC like tylenol and advil if needed.   Plan: follow up in 1 month.  Rowe Pavy RN, BSN, Careers adviser for [employer] Mobile: [phone]       Patient Stated (pt-stated)        AG RN nursing goals: Goals: Patient will report decreased constipation over the next 4 months.  01/13/2021 Assessment:  Patient reports severe constipation in the past. Occasional rectal bleeding from hard stool.  Currently patient is eating pecans for fiber and exercising. Reports she feels Wellbutrin caused an increase in constipation.  Interventions:  encouraged patient to continue to eat high fiber diet, exercise and increase water intake. Reviewed OTC stool softeners if needed.   Plan: next home visit in 1 month.  Rowe Pavy RN, BSN, CEN RN Case Production designer, theatre/television/film for Aging Chemical engineer Mobile:  [phone]          Patient Stated (pt-stated)        AG RN nursing goals:  Goal: Patient will report improved vision after eye surgery 02/26/2021 and 03/12/2021.  Assessment: dark skin around both eyes.  Patient is anxious and ready to get her eye surgery completed. Patient has a plan for family to assist her for the 2 days post surgery.  Interventions: Encouraged patient to follow all post op instructions. Reviewed importance of asking questions prior to surgery and after surgery as well. Provided [facility] calendar for ease of keeping up with appointments. Provided my contact information as well.  Plan: next home visit in 1 month.         Rowe Pavy RN, BSN, Careers adviser for [employer] Mobile: [phone]

## 2021-01-13 NOTE — Patient Instructions (Signed)
Visit Information  PATIENT GOALS:   Goals Addressed               This Visit's Progress     Patient Stated (pt-stated)        AG RN Nursing goals:  Patient will report decreased back pain in the next 4 months.  01/13/2021  Assessment: Reports chronic back pain. Uses heating pad and recliner for pain when needed.  Interventions: encouraged patient to continue to go to [employer] sne[employer] and be active. Reviewed OTC like tylenol and advil if needed.   Plan: follow up in 1 month.  Rowe Pavy RN, BSN, Careers adviser for [employer] Mobile: [phone]       Patient Stated (pt-stated)        AG RN nursing goals: Goals: Patient will report decreased constipation over the next 4 months.  01/13/2021 Assessment:  Patient reports severe constipation in the past. Occasional rectal bleeding from hard stool.  Currently patient is eating pecans for fiber and exercising. Reports she feels Wellbutrin caused an increase in constipation.  Interventions:  encouraged patient to continue to eat high fiber diet, exercise and increase water intake. Reviewed OTC stool softeners if needed.   Plan: next home visit in 1 month.  Rowe Pavy RN, BSN, CEN RN Case Production designer, theatre/television/film for Aging Chemical engineer Mobile: [phone]          Patient Stated (pt-stated)        AG RN nursing goals:  Goal: Patient will report improved vision after eye surgery 02/26/2021 and 03/12/2021.  Assessment: dark skin around both eyes.  Patient is anxious and ready to get her eye surgery completed. Patient has a plan for family to assist her for the 2 days post surgery.  Interventions: Encouraged patient to follow all post op instructions. Reviewed importance of asking questions prior to surgery and after surgery as well. Provided [facility] calendar for ease of keeping up with appointments. Provided my contact information as well.  Plan: next home visit in 1 month.

## 2021-02-10 ENCOUNTER — Other Ambulatory Visit: Payer: Self-pay

## 2021-02-10 NOTE — Patient Outreach (Signed)
Aging Gracefully Program  RN Visit  02/10/2021  Rebecca Mata 1955-07-29 [id number]  Visit:   RN home visit #2  Start Time:   1215 End Time:  1300  Total Minutes:   45  Readiness To Change Score:     Universal RN Interventions: Calendar Distribution: Yes Exercise Review: Yes Medications: Yes Medication Changes: No Mood: Yes Pain: Yes PCP Advocacy/Support: No Fall Prevention: Yes Incontinence: Yes Client View Of His/Her Situation: Reports she has had back pain on and off this week.  Reports she is anxious about her pending eye surgery.  has not been to yoga this week.  Will return to yoga tomorrow. 4/10 today. slept on heating pad last night.  Healthcare Provider Communication:none    Clinician View of Client Situation:Patient doing well. Home neat and clean.  Community housing has done Research officer, trade union of His/Her Situation: Regulatory affairs officer Of His/Her Situation: Reports she has had back pain on and off this week.  Reports she is anxious about her pending eye surgery.  has not been to yoga this week.  Will return to yoga tomorrow. 4/10 today. slept on heating pad last night.  Medication Assessment:no changes    OT Update: pending modifications  Session Summary: client doing well. Continues to exercise. Has a boyfriend which makes her happy. Pending eye surgery.   Goals Addressed               This Visit's Progress     Patient Stated (pt-stated)        AG RN Nursing goals:  Patient will report decreased back pain in the next 4 months.  01/13/2021  Assessment: Reports chronic back pain. Uses heating pad and recliner for pain when needed.  Interventions: encouraged patient to continue to go to [employer] sne[employer] and be active. Reviewed OTC like tylenol and advil if needed.   Plan: follow up in 1 month.  Rowe Pavy RN, BSN, CEN RN Case Production designer, theatre/television/film for [employer] [facility] Mobile: [phone]   02/10/2021 Assessment: continues  to have  back pain. Has noticed back pain is worse when she does not attend yoga.  Interventions:  encouraged patient to attend yoga on a regular basis. Provided Aging gracefully exercises and demonstrated exercises. Patient able to return demonstration.   PLAN: Follow up on 03/24/2021 after eye surgery  Rowe Pavy RN, BSN, CEN RN Case Manager for Aging Gracefully [facility] Mobile: [phone]       Patient Stated (pt-stated)        AG RN nursing goals: Goals: Patient will report decreased constipation over the next 4 months.  01/13/2021 Assessment:  Patient reports severe constipation in the past. Occasional rectal bleeding from hard stool.  Currently patient is eating pecans for fiber and exercising. Reports she feels Wellbutrin caused an increase in constipation.  Interventions:  encouraged patient to continue to eat high fiber diet, exercise and increase water intake. Reviewed OTC stool softeners if needed.   Plan: next home visit in 1 month.  Rowe Pavy RN, BSN, CEN RN Case Production designer, theatre/television/film for [employer] [facility] Mobile: [phone]    02/10/2021 Assessment: reports decrease in constipation. Continues to eat nuts and take a probiotic Interventions: Encouraged patient to continue with her fiber and exercise.  PLAN: follow up plan 03/24/2021  Rowe Pavy RN, BSN, CEN RN Case Manager for Aging Gracefully [facility] Mobile: [phone]       Patient Stated (pt-stated)  AG RN nursing goals:  Goal: Patient will report improved vision after eye surgery 02/26/2021 and 03/12/2021.  Assessment: dark skin around both eyes.  Patient is anxious and ready to get her eye surgery completed. Patient has a plan for family to assist her for the 2 days post surgery.  Interventions: Encouraged patient to follow all post op instructions. Reviewed importance of asking questions prior to surgery and after surgery as well. Provided [facility] calendar for  ease of keeping up with appointments. Provided my contact information as well.  Plan: next home visit in 1 month.   02/10/2021 Assessment: reviewed fear of pending eye surgery. Interventions: offered support and a listening ear. PLAN: follow up home visit 03/24/2021  Rowe Pavy RN, BSN, CEN RN Case Manager for Aging Chemical engineer Mobile: [phone]

## 2021-02-10 NOTE — Patient Instructions (Signed)
Visit Information   Goals Addressed               This Visit's Progress     Patient Stated (pt-stated)        AG RN Nursing goals:  Patient will report decreased back pain in the next 4 months.  01/13/2021  Assessment: Reports chronic back pain. Uses heating pad and recliner for pain when needed.  Interventions: encouraged patient to continue to go to silver sneaker and be active. Reviewed OTC like tylenol and advil if needed.   Plan: follow up in 1 month.  Rowe Pavy RN, BSN, CEN RN Case Production designer, theatre/television/film for [employer] [facility] Mobile: [phone]   02/10/2021 Assessment: continues  to have back pain. Has noticed back pain is worse when she does not attend yoga.  Interventions:  encouraged patient to attend yoga on a regular basis. Provided Aging gracefully exercises and demonstrated exercises. Patient able to return demonstration.   PLAN: Follow up on 03/24/2021 after eye surgery  Rowe Pavy RN, BSN, CEN RN Case Manager for Aging Gracefully [facility] Mobile: [phone]       Patient Stated (pt-stated)        AG RN nursing goals: Goals: Patient will report decreased constipation over the next 4 months.  01/13/2021 Assessment:  Patient reports severe constipation in the past. Occasional rectal bleeding from hard stool.  Currently patient is eating pecans for fiber and exercising. Reports she feels Wellbutrin caused an increase in constipation.  Interventions:  encouraged patient to continue to eat high fiber diet, exercise and increase water intake. Reviewed OTC stool softeners if needed.   Plan: next home visit in 1 month.  Rowe Pavy RN, BSN, CEN RN Case Production designer, theatre/television/film for [employer] [facility] Mobile: [phone]    02/10/2021 Assessment: reports decrease in constipation. Continues to eat nuts and take a probiotic Interventions: Encouraged patient to continue with her fiber and exercise.  PLAN: follow up plan  03/24/2021  Rowe Pavy RN, BSN, CEN RN Case Manager for Aging Gracefully [facility] Mobile: [phone]       Patient Stated (pt-stated)        AG RN nursing goals:  Goal: Patient will report improved vision after eye surgery 02/26/2021 and 03/12/2021.  Assessment: dark skin around both eyes.  Patient is anxious and ready to get her eye surgery completed. Patient has a plan for family to assist her for the 2 days post surgery.  Interventions: Encouraged patient to follow all post op instructions. Reviewed importance of asking questions prior to surgery and after surgery as well. Provided [facility] calendar for ease of keeping up with appointments. Provided my contact information as well.  Plan: next home visit in 1 month.   02/10/2021 Assessment: reviewed fear of pending eye surgery. Interventions: offered support and a listening ear. PLAN: follow up home visit 03/24/2021  Rowe Pavy RN, BSN, CEN RN Case Manager for Aging Chemical engineer Mobile: [phone]

## 2021-02-26 DIAGNOSIS — H25811 Combined forms of age-related cataract, right eye: Secondary | ICD-10-CM | POA: Diagnosis not present

## 2021-03-10 DIAGNOSIS — H2512 Age-related nuclear cataract, left eye: Secondary | ICD-10-CM | POA: Diagnosis not present

## 2021-03-12 DIAGNOSIS — H25812 Combined forms of age-related cataract, left eye: Secondary | ICD-10-CM | POA: Diagnosis not present

## 2021-03-18 NOTE — Progress Notes (Signed)
Patient ID: Rebecca Mata, female    DOB: 19-Mar-1956  MRN: [id number]  CC: Pre-Diabetes Follow-Up   Subjective: Rebecca Mata is a 65 y.o. female who presents for pre-diabetes follow-up.    Her concerns today include:   PREDIABETES FOLLOW-UP: Monitoring what she eats and exercising. No issues/concerns.  2. HYPERLIPIDEMIA FOLLOW-UP: Reports not taking Atorvastatin because it caused her to not feel well. She is monitoring what she eats.    3. VITAMIN D DEFICIENCY FOLLOW-UP: Taking over-the-counter Vitamin D pills 50,000 units.  Patient Active Problem List   Diagnosis Date Noted   Vitamin D deficiency 09/20/2020   Hyperlipidemia 09/20/2020   Prediabetes 09/18/2020     Current Outpatient Medications on File Prior to Visit  Medication Sig Dispense Refill   atorvastatin (LIPITOR) 40 MG tablet Take 1 tablet (40 mg total) by mouth daily. (Patient not taking: No sig reported) 90 tablet 0   buPROPion (WELLBUTRIN SR) 150 MG 12 hr tablet Take 1 tablet (150 mg total) by mouth twice (2 times) daily. (Patient not taking: No sig reported) 60 tablet 2   Cholecalciferol (VITAMIN D3) 1.25 MG (50000 UT) TABS TAKE 1 TABLET BY MOUTH EVERY 7 DAYS FOR 12 DOSES 12 tablet o   Cholecalciferol (VITAMIN D3) 125 MCG (5000 UT) CAPS TAKE 1 TABLET BY MOUTH EVERY 7 (SEVEN) DAYS FOR 12 DOSES.     No current facility-administered medications on file prior to visit.    Allergies  Allergen Reactions   Codeine     Social History   Socioeconomic History   Marital status: Widowed    Spouse name: Not on file   Number of children: 2   Years of education: Not on file   Highest education level: Not on file  Occupational History   Not on file  Tobacco Use   Smoking status: Every Day    Packs/day: 0.50    Years: 42.00    Pack years: 21.00    Types: Cigarettes   Smokeless tobacco: Never  Vaping Use   Vaping Use: Never used  Substance and Sexual Activity   Alcohol use: Not Currently     Alcohol/week: 0.0 standard drinks   Drug use: Never   Sexual activity: Not Currently    Birth control/protection: Post-menopausal  Other Topics Concern   Not on file  Social History Narrative   Not on file   Social Determinants of Health   Financial Resource Strain: Not on file  Food Insecurity: Not on file  Transportation Needs: Not on file  Physical Activity: Not on file  Stress: Not on file  Social Connections: Not on file  Intimate Partner Violence: Not on file    Family History  Problem Relation Age of Onset   Heart disease Mother    Stroke Mother    Brain cancer Father    Diabetes Sister     Past Surgical History:  Procedure Laterality Date   CHOLECYSTECTOMY     TUBAL LIGATION      ROS: Review of Systems Negative except as stated above  PHYSICAL EXAM: BP 102/68 (BP Location: Left Arm, Patient Position: Sitting, Cuff Size: Large)   Pulse 65   Temp 98.3 F (36.8 C)   Resp 20   Ht 5' 7.48" (1.714 m)   Wt 218 lb 13 oz (99.3 kg)   SpO2 96%   BMI 33.78 kg/m    Physical Exam HENT:     Head: Normocephalic and atraumatic.  Eyes:     Extraocular  Movements: Extraocular movements intact.     Conjunctiva/sclera: Conjunctivae normal.     Pupils: Pupils are equal, round, and reactive to light.  Cardiovascular:     Rate and Rhythm: Normal rate and regular rhythm.     Pulses: Normal pulses.     Heart sounds: Normal heart sounds.  Pulmonary:     Effort: Pulmonary effort is normal.     Breath sounds: Normal breath sounds.  Musculoskeletal:     Cervical back: Normal range of motion and neck supple.  Neurological:     General: No focal deficit present.     Mental Status: She is alert and oriented to person, place, and time.  Psychiatric:        Mood and Affect: Mood normal.        Behavior: Behavior normal.   Results for orders placed or performed in visit on 03/22/21  POCT glycosylated hemoglobin (Hb A1C)  Result Value Ref Range   Hemoglobin A1C 6.0 (A)  4.0 - 5.6 %   HbA1c POC (<> result, manual entry)     HbA1c, POC (prediabetic range)     HbA1c, POC (controlled diabetic range)      ASSESSMENT AND PLAN: 1. Prediabetes: - Hemoglobin today 6.0%.  - Discussed the importance of healthy eating habits, low-carbohydrate diet, low-sugar diet, and regular aerobic exercise (at least 150 minutes a week as tolerated) to achieve or maintain control of prediabetes. No medication needed as of present.  - Follow-up with primary provider in 6 months or sooner if needed.  - POCT glycosylated hemoglobin (Hb A1C)  2. Hyperlipidemia, unspecified hyperlipidemia type: - Patient intolerant to Atorvastatin because it doesn't make her feel good when she takes it. - Lipid panel to screen for level of cholesterol control.  - Follow-up with primary provider as scheduled.  - Lipid Panel  3. Vitamin D deficiency: - Screening for deficiency. - Vitamin D, 25-hydroxy  4. Elevated liver enzymes: - Screening for liver function.  - AST - ALT   Patient was given the opportunity to ask questions.  Patient verbalized understanding of the plan and was able to repeat key elements of the plan. Patient was given clear instructions to go to Emergency Department or return to medical center if symptoms don't improve, worsen, or new problems develop.The patient verbalized understanding.   Orders Placed This Encounter  Procedures   AST   ALT   Vitamin D, 25-hydroxy   Lipid Panel   POCT glycosylated hemoglobin (Hb A1C)      Return in about 6 months (around 09/19/2021) for Follow-Up or next available prediabetes.  Rema Fendt, NP

## 2021-03-22 ENCOUNTER — Ambulatory Visit (INDEPENDENT_AMBULATORY_CARE_PROVIDER_SITE_OTHER): Payer: PPO | Admitting: Family

## 2021-03-22 ENCOUNTER — Encounter: Payer: Self-pay | Admitting: Family

## 2021-03-22 ENCOUNTER — Other Ambulatory Visit: Payer: Self-pay

## 2021-03-22 VITALS — BP 102/68 | HR 65 | Temp 98.3°F | Resp 20 | Ht 67.48 in | Wt 218.8 lb

## 2021-03-22 DIAGNOSIS — Z716 Tobacco abuse counseling: Secondary | ICD-10-CM

## 2021-03-22 DIAGNOSIS — E559 Vitamin D deficiency, unspecified: Secondary | ICD-10-CM | POA: Diagnosis not present

## 2021-03-22 DIAGNOSIS — E785 Hyperlipidemia, unspecified: Secondary | ICD-10-CM

## 2021-03-22 DIAGNOSIS — R7303 Prediabetes: Secondary | ICD-10-CM

## 2021-03-22 DIAGNOSIS — R748 Abnormal levels of other serum enzymes: Secondary | ICD-10-CM | POA: Diagnosis not present

## 2021-03-22 LAB — POCT GLYCOSYLATED HEMOGLOBIN (HGB A1C): Hemoglobin A1C: 6 % — AB (ref 4.0–5.6)

## 2021-03-22 NOTE — Progress Notes (Signed)
Prediabetes discussed in office.

## 2021-03-22 NOTE — Progress Notes (Signed)
Pt presents for prediabetes and hyperlipidemia

## 2021-03-23 ENCOUNTER — Other Ambulatory Visit: Payer: Self-pay | Admitting: Family

## 2021-03-23 DIAGNOSIS — E785 Hyperlipidemia, unspecified: Secondary | ICD-10-CM

## 2021-03-23 LAB — LIPID PANEL
Chol/HDL Ratio: 5.8 ratio — ABNORMAL HIGH (ref 0.0–4.4)
Cholesterol, Total: 226 mg/dL — ABNORMAL HIGH (ref 100–199)
HDL: 39 mg/dL — ABNORMAL LOW (ref >39)
LDL Chol Calc (NIH): 166 mg/dL — ABNORMAL HIGH (ref 0–99)
Triglycerides: 114 mg/dL (ref 0–149)
VLDL Cholesterol Cal: 21 mg/dL (ref 5–40)

## 2021-03-23 LAB — AST: AST: 15 IU/L (ref 0–40)

## 2021-03-23 LAB — ALT: ALT: 9 IU/L (ref 0–32)

## 2021-03-23 LAB — VITAMIN D 25 HYDROXY (VIT D DEFICIENCY, FRACTURES): Vit D, 25-Hydroxy: 55.7 ng/mL (ref 30.0–100.0)

## 2021-03-23 NOTE — Progress Notes (Signed)
Vitamin D normal. Continue over-the-counter Vitamin D supplement.   Liver function back to normal since 6 months ago.   Cholesterol remaining higher than normal and about the same since 6 months ago. As discussed in office on yesterday we will go ahead with referral to [facility] considering patient is intolerant to Statins. Their office should call patient within 2 weeks with appointment details.

## 2021-03-29 ENCOUNTER — Other Ambulatory Visit: Payer: Self-pay

## 2021-03-29 NOTE — Patient Outreach (Signed)
Aging Gracefully Program  RN Visit  03/29/2021  Rebecca Mata January 30, 1956 [id number]  Visit:   RN Visit 3  Start Time:   1030 End Time:   1115 Total Minutes:   45  Readiness To Change Score:     Universal RN Interventions: Calendar Distribution: Yes Exercise Review: Yes Medications: Yes Medication Changes: Yes Mood: Yes Pain: Yes PCP Advocacy/Support: No Fall Prevention: Yes Incontinence: Yes Clinician View Of Client Situation: Wearing mask, Covid negative.   Arrived to home and patient had forgotten appointment. friend with patient.  Home neat and clean. Client View Of His/Her Situation: Reports eye surgery went well.  Recent weight loss of 6 pounds.   Reports  back pain decreased with weight loss.. Continues to do home exercise . 5/10.   feels stronger from exercises. Reports contracts are signed and ready for home repairs on 04/08/2021.   Denies any changes in medications.  Healthcare Provider Communication: Healthcare Provider Response According to RN: none Healthcare Provider Response According To Client: none  Clinician View of Client Situation: Clinician View Of Client Situation: Wearing mask, Covid negative.   Arrived to home and patient had forgotten appointment. friend with patient.  Home neat and clean. Client's View of His/Her Situation: Client View Of His/Her Situation: Reports eye surgery went well.  Recent weight loss of 6 pounds.   Reports  back pain decreased with weight loss.. Continues to do home exercise . 5/10.   feels stronger from exercises. Reports contracts are signed and ready for home repairs on 04/08/2021.   Denies any changes in medications.  Medication Assessment: no changes    OT Update: contracts signed pending home modifications to start on 04/08/2021  Session Summary: doing very well. No falls. Eye surgery completed and vision is improved.   Goals Addressed               This Visit's Progress     Patient Stated (pt-stated)         AG RN Nursing goals:  Patient will report decreased back pain in the next 4 months.  01/13/2021  Assessment: Reports chronic back pain. Uses heating pad and recliner for pain when needed.  Interventions: encouraged patient to continue to go to silver sne[employer] and be active. Reviewed OTC like tylenol and advil if needed.   Plan: follow up in 1 month.  Rowe Pavy RN, BSN, CEN RN Case Production designer, theatre/television/film for [employer] [facility] Mobile: [phone]   02/10/2021 Assessment: continues  to have back pain. Has noticed back pain is worse when she does not attend yoga.  Interventions:  encouraged patient to attend yoga on a regular basis. Provided Aging gracefully exercises and demonstrated exercises. Patient able to return demonstration.   PLAN: Follow up on 03/24/2021 after eye surgery  Rowe Pavy RN, BSN, CEN RN Case Manager for [facility] Mobile: [phone]   03/29/2021  Assessment:  Continues to have back pain.  5/10.  Reports pain decreased with weight loss. Continues to do home exercises.  Interventions:  Encouraged patient to continue to do home exercises.  Plan:  Follow up in 1 month.  Rowe Pavy RN, BSN, Careers adviser for [employer] Mobile: [phone]       Patient Stated (pt-stated)        AG RN nursing goals: Goals: Patient will report decreased constipation over the next 4 months.  01/13/2021 Assessment:  Patient reports severe constipation in the past. Occasional rectal bleeding  from hard stool.  Currently patient is eating pecans for fiber and exercising. Reports she feels Wellbutrin caused an increase in constipation.  Interventions:  encouraged patient to continue to eat high fiber diet, exercise and increase water intake. Reviewed OTC stool softeners if needed.   Plan: next home visit in 1 month.  Rowe Pavy RN, BSN, CEN RN Case Production designer, theatre/television/film for [employer] [facility] Mobile: [phone]    02/10/2021 Assessment: reports decrease in constipation. Continues to eat nuts and take a probiotic Interventions: Encouraged patient to continue with her fiber and exercise.  PLAN: follow up plan 03/24/2021  Rowe Pavy RN, BSN, CEN RN Case Manager for [facility] Mobile: [phone]    03/29/2021 Assessment:  Reports decrease in constipation. Reports she continues to do fasting.  Reports BM's daily or every other day.   Interventions: Encouraged patient to continue to do home exercises.   Plan: follow up in 1 month. Rowe Pavy RN, BSN, Careers adviser for [employer] [facility] Mobile: [phone]       COMPLETED: Patient Stated (pt-stated)        AG RN nursing goals:  Goal: Patient will report improved vision after eye surgery 02/26/2021 and 03/12/2021.  Assessment: dark skin around both eyes.  Patient is anxious and ready to get her eye surgery completed. Patient has a plan for family to assist her for the 2 days post surgery.  Interventions: Encouraged patient to follow all post op instructions. Reviewed importance of asking questions prior to surgery and after surgery as well. Provided [facility] calendar for ease of keeping up with appointments. Provided my contact information as well.  Plan: next home visit in 1 month.   02/10/2021 Assessment: reviewed fear of pending eye surgery. Interventions: offered support and a listening ear. PLAN: follow up home visit 03/24/2021  Rowe Pavy RN, BSN, CEN RN Case Manager for [facility] Mobile: [phone]   03/29/2021 Assessment:   had surgery to both eyes. Reports vision is much improved.  Goal completed  Rowe Pavy RN, BSN, Careers adviser for [employer] [facility] Mobile: [phone]         Rowe Pavy RN, BSN, Careers adviser for [employer] Mobile: [phone]

## 2021-04-05 ENCOUNTER — Other Ambulatory Visit: Payer: Self-pay

## 2021-04-05 ENCOUNTER — Telehealth (INDEPENDENT_AMBULATORY_CARE_PROVIDER_SITE_OTHER): Payer: PPO | Admitting: Nurse Practitioner

## 2021-04-05 ENCOUNTER — Encounter: Payer: Self-pay | Admitting: Nurse Practitioner

## 2021-04-05 DIAGNOSIS — H6692 Otitis media, unspecified, left ear: Secondary | ICD-10-CM | POA: Diagnosis not present

## 2021-04-05 MED ORDER — CEFDINIR 300 MG PO CAPS: 300.0000 mg | ORAL_CAPSULE | Freq: Two times a day (BID) | ORAL | 0 refills | Status: AC

## 2021-04-05 NOTE — Progress Notes (Signed)
Virtual Visit via Telephone Note  I connected with Rebecca Mata on 04/05/21 at  2:20 PM EST by telephone and verified that I am speaking with the correct person using two identifiers.  Location: Patient: home Provider: office   I discussed the limitations, risks, security and privacy concerns of performing an evaluation and management service by telephone and the availability of in person appointments. I also discussed with the patient that there may be a patient responsible charge related to this service. The patient expressed understanding and agreed to proceed.   History of Present Illness:  Patient presents today for ear pain through televisit.  Patient states that symptoms started 3 days ago.  She did have a negative home COVID test.  Patient reports left ear pain with some drainage noted.  She denies any significant sinus pressure pain.  She states that the pain is severe and woke her up from her sleep last night.  Denies f/c/s, n/v/d, hemoptysis, PND, chest pain or edema.     Observations/Objective:  Vitals with BMI 03/22/2021 10/16/2020 09/18/2020  Height 5' 7.48" 5' 7.48" 5' 7.48"  Weight 218 lbs 13 oz 229 lbs 10 oz 236 lbs 3 oz  BMI 33.78 35.45 36.47  Systolic 102 139 427  Diastolic 68 74 83  Pulse 65 71 91      Assessment and Plan:   Left Otitis Media:  Will treat for otitis media with cefdinir per symptoms  Stay well hydrated  May take ibuprofen for pain and inflammation   Follow up:  Follow up if needed    I discussed the assessment and treatment plan with the patient. The patient was provided an opportunity to ask questions and all were answered. The patient agreed with the plan and demonstrated an understanding of the instructions.   The patient was advised to call back or seek an in-person evaluation if the symptoms worsen or if the condition fails to improve as anticipated.  I provided 23 minutes of non-face-to-face time during this  encounter.   Ivonne Andrew, NP

## 2021-04-05 NOTE — Patient Instructions (Addendum)
Left Otitis Media:  Will treat for otitis media with cefdinir per symptoms  Stay well hydrated  May take ibuprofen for pain and inflammation   Follow up:  Follow up if needed

## 2021-05-12 ENCOUNTER — Other Ambulatory Visit: Payer: Self-pay

## 2021-05-12 NOTE — Patient Outreach (Signed)
Aging Gracefully Program  RN Visit  05/12/2021  Rebecca Mata [birth year] [id number]  Visit:   RN home visit #4  Start Time:   3007 End Time:   1100 Total Minutes:   64  Readiness To Change Score:     Universal RN Interventions: Calendar Distribution: Yes Exercise Review: Yes Medications: Yes Medication Changes: Yes Mood: Yes Pain: Yes PCP Advocacy/Support: No Fall Prevention: Yes Incontinence: Yes Clinician View Of Client Situation: Wearing mask. Covid screening negative. [employer] here. Home neat and clean. Client View Of His/Her Situation: Reports vision is good. Continues to have floaters and eyes continue to be itchy. reports not able to go to the gym as often due to workers in the home.  Reports being happy with repairs.  Roof is done.  Healthcare Provider Communication: none    Clinician View of Client Situation: Clinician View Of Client Situation: Wearing mask. Covid screening negative. [employer] here. Home neat and clean. Client's View of His/Her Situation: Client View Of His/Her Situation: Reports vision is good. Continues to have floaters and eyes continue to be itchy. reports not able to go to the gym as often due to workers in the home.  Reports being happy with repairs.  Roof is done.  Medication Assessment: no changes    OT Update: pending completion of modifications  Session Summary: doing well.     Goals Addressed               This Visit's Progress     COMPLETED: Patient Stated (pt-stated)        AG RN Nursing goals:  Patient will report decreased back pain in the next 4 months.  01/13/2021  Assessment: Reports chronic back pain. Uses heating pad and recliner for pain when needed.  Interventions: encouraged patient to continue to go to [employer] sne[employer] and be active. Reviewed OTC like tylenol and advil if needed.   Plan: follow up in 1 month.  Tomasa Rand RN, BSN, CEN RN Case Freight forwarder for [employer] Mobile: [phone]   02/10/2021 Assessment: continues  to have back pain. Has noticed back pain is worse when she does not attend yoga.  Interventions:  encouraged patient to attend yoga on a regular basis. Provided Aging gracefully exercises and demonstrated exercises. Patient able to return demonstration.   PLAN: Follow up on 03/24/2021 after eye surgery  Tomasa Rand RN, BSN, CEN RN Case Manager for [employer] Mobile: [phone]   03/29/2021  Assessment:  Continues to have back pain.  5/10.  Reports pain decreased with weight loss. Continues to do home exercises.  Interventions:  Encouraged patient to continue to do home exercises.  Plan:  Follow up in 1 month.  Tomasa Rand RN, BSN, CEN RN Case Freight forwarder for [employer] Mobile: [phone]   05/12/2021 Assessment:  has not been about to go exercises due to work at home.  Reports severe back pain this morning. Reports now that she is up walking pain is decreased. Reports overall pain is decreased.  Interventions:  Encouraged patient to get back to exercise plan.   Encouraged patient to continue to do home exercise plan.  Plan: goal met  Tomasa Rand RN, BSN, CEN RN Case Freight forwarder for [employer] Mobile: [phone]       COMPLETED: Patient Stated (pt-stated)        AG RN nursing goals: Goals: Patient will report decreased constipation over the next 4  months.  01/13/2021 Assessment:  Patient reports severe constipation in the past. Occasional rectal bleeding from hard stool.  Currently patient is eating pecans for fiber and exercising. Reports she feels Wellbutrin caused an increase in constipation.  Interventions:  encouraged patient to continue to eat high fiber diet, exercise and increase water intake. Reviewed OTC stool softeners if needed.   Plan: next home visit in 1 month.  Tomasa Rand  RN, BSN, CEN RN Case Freight forwarder for [employer] Mobile: [phone]    02/10/2021 Assessment: reports decrease in constipation. Continues to eat nuts and take a probiotic Interventions: Encouraged patient to continue with her fiber and exercise.  PLAN: follow up plan 03/24/2021  Tomasa Rand RN, BSN, CEN RN Case Manager for [employer] Mobile: [phone]    03/29/2021 Assessment:  Reports decrease in constipation. Reports she continues to do fasting.  Reports BM's daily or every other day.   Interventions: Encouraged patient to continue to do home exercises.   Plan: follow up in 1 month. Tomasa Rand RN, BSN, CEN RN Case Freight forwarder for [employer] Mobile: [phone]   05/12/2021 Assessment: Reports she is drinking coffee and lemon juice and now having 3 bowel movement per day.  Reports no constipation.  Interventions: encouraged patient to continue what works for here.  Encouraged patient to continue her exercise plan.  PLAN: goal met  Tomasa Rand RN, BSN, CEN RN Case Freight forwarder for [facility] Mobile: [phone]         Last home visit.  Tomasa Rand RN, BSN, Careers information officer for [employer] Mobile: [phone]

## 2021-06-07 ENCOUNTER — Other Ambulatory Visit: Payer: PPO | Admitting: Occupational Therapy

## 2021-06-07 ENCOUNTER — Other Ambulatory Visit: Payer: Self-pay

## 2021-06-07 NOTE — Patient Outreach (Signed)
Aging Gracefully Program  OT Follow-Up Visit  06/07/2021  Rebecca Mata 10-23-1955 [id number]  Visit:  1- Initial Visit  Start Time:  1630 End Time:  1700 Total Minutes:  30  Readiness to Change Score :  Readiness to Change Score: 10  Durable Medical Equipment: Durable Medical Equipment: Shower Chair With Back Durable Medical Equipment Distribution Date: 06/07/21   Goals:   Goals Addressed             This Visit's Progress    COMPLETED: Patient Stated       Feel safe at her front and back entrances to her house. Front entrance needs railing to be secured at steps and on porch. Back entrance needs a ramp so she can have a level entrance (no steps) to get into her house.  ACTION PLANNING - FUNCTIONAL MOBILITY  Target Problem Area: Safety in and out of her house   Why Problem May Occur: Decreased balance, steps at both entrances, loose rails at front porch.   Target Goal: Safety and independence getting in and out of the house   STRATEGIES Saving Your Energy: DO: DON'T:  Take breaks as you go up steps (or as you get to top of steps) as needed. Also with going in and out of the house at the back with ramp. Be in a hurry (this can cause your balance to be off.  Modifying your home environment and making it safe: DO: DON'T:  Use one hand on rails at steps or ramp. Go up and down steps or ramp with both hands full  Provide adequate lighting Use dim lights or lights that cast a lot of shadows  Simplifying the way you set up tasks or daily routines: DO: DON'T:  Move slowly Rush during transfers or walking    Practice It is important to practice the strategies so we can determine if they will be effective in helping to reach your goal. Follow these specific recommendations: 1. Try not have both hands full when going up/down steps or ramp 2. Pull car up to ramp if using it (uneven ground can be a trip hazzard)  If a strategy does not work the first time, try it again  and again (and maybe again). We may make some changes over the next few sessions, based on how they work.  Rebecca Mata, OTR/L       06/07/2021     COMPLETED: Patient Stated       She would like to feel more safe in her bathrooms. Hall bath needs a grab bar for shower, hand held shower, shower seat, and a comfort height toilet. Master bath needs grab bars in shower stall.  ACTION PLANNING - BATHING  Target Problem Area: Decreased safety with showering  Why Problem May Occur: Decreased balance thus standing in the shower is not the best for her to be doing.   Target Goal: Safety and independence with showering   STRATEGIES Saving Your Energy: DO: DON'T:  Use a shower seat Minimize standing while bathing, it uses more energy  Keep all items you'll need within easy reach Rush  Modifying your home environment and making it safe: DO: DON'T:  Use grab bars in the shower    Place a rubber mat the size you need in the shower or use non skid treds. Place loose rugs in the bathroom- they can trip you or your walker/cane can get caught on them  Make sure the bathroom is well lit  Use handheld shower head   Simplifying the way you set up tasks or daily routines: DO: DON'T:  Plan to shower before you're overly tired Rush through [employer] all items before getting started    Practice It is important to practice the strategies so we can determine if they will be effective in helping to reach your goal. Follow these specific recommendations: 1.Use your new shower seat. 2.Use a non-skid mat or no skid adhesives so floor of shower won't be slippery with getting out of after showering.  If a strategy does not work the first time, try it again and again (and maybe again). Rebecca Mata, OTR/L       06/07/2021     Patient Stated       She would like to be able to pick up items from floor and reach up high to get items with more ease ( a reacher would help with this)         Post Clinical Reasoning: Client Action (Goal) One Interventions: ramp at back door and secured railing at front door Targeted Problem Area Status: A Lot Better (she reports) Client Action (Goal) Two Interventions: grab bars and shower seat for hall bath as well as comfort height toilet Targeted Problem Area Status: A Lot Better Clinician View Of Client Situation:: Rebecca Mata continues to do well. She was excited to get her shower chair and looks forward to using it. She already has a shower mat in tub. Client View Of His/Her Situation:: Rebecca Mata is happy with all of her home modifications and home repairs.She mentioned it is hard for her to reach down to pick items off of the floor and reach up really high to get items--we discussed that a reacher will help with this. Next Visit Plan:: Reacher  Rebecca Mata, OTR/L Aging Gracefully [phone]

## 2021-06-07 NOTE — Patient Instructions (Addendum)
She would like to feel more safe in her bathrooms. Hall bath needs a grab bar for shower, hand held shower, shower seat, and a comfort height toilet. Master bath needs grab bars in shower stall.  ACTION PLANNING - BATHING  Target Problem Area: Decreased safety with showering  Why Problem May Occur: Decreased balance thus standing in the shower is not the best for her to be doing.   Target Goal: Safety and independence with showering   STRATEGIES Saving Your Energy: DO: DON'T:  Use a shower seat Minimize standing while bathing, it uses more energy  Keep all items you'll need within easy reach Rush   Modifying your home environment and making it safe: DO: DON'T:  Use grab bars in the shower    Place a rubber mat the size you need in the shower or use non skid treds. Place loose rugs in the bathroom- they can trip you or your walker/cane can get caught on them  Make sure the bathroom is well lit    Use handheld shower head    Simplifying the way you set up tasks or daily routines: DO: DON'T:  Plan to shower before you're overly tired Rush through [employer] all items before getting started    Practice It is important to practice the strategies so we can determine if they will be effective in helping to reach your goal. Follow these specific recommendations: 1.Use your new shower seat. 2.Use a non-skid mat or no skid adhesives so floor of shower won't be slippery with getting out of after showering.  If a strategy does not work the first time, try it again and again (and maybe again). [name], OTR/L       06/07/2021   Feel safe at her front and back entrances to her house. Front entrance needs railing to be secured at steps and on porch. Back entrance needs a ramp so she can have a level entrance (no steps) to get into her house.  ACTION PLANNING - FUNCTIONAL MOBILITY  Target Problem Area: Safety in and out of her house   Why Problem May Occur: Decreased  balance, steps at both entrances, loose rails at front porch.   Target Goal: Safety and independence getting in and out of the house   STRATEGIES Saving Your Energy: DO: DON'T:  Take breaks as you go up steps (or as you get to top of steps) as needed. Also with going in and out of the house at the back with ramp. Be in a hurry (this can cause your balance to be off.   Modifying your home environment and making it safe: DO: DON'T:  Use one hand on rails at steps or ramp. Go up and down steps or ramp with both hands full  Provide adequate lighting Use dim lights or lights that cast a lot of shadows   Simplifying the way you set up tasks or daily routines: DO: DON'T:  Move slowly Rush during transfers or walking    Practice It is important to practice the strategies so we can determine if they will be effective in helping to reach your goal. Follow these specific recommendations: 1. Try not have both hands full when going up/down steps or ramp 2. Pull car up to ramp if using it (uneven ground can be a trip hazzard)  If a strategy does not work the first time, try it again and again (and maybe again). We may make some changes over the next few  sessions, based on how they work.  [name], OTR/L       06/07/2021

## 2021-06-15 ENCOUNTER — Other Ambulatory Visit: Payer: Self-pay

## 2021-06-15 ENCOUNTER — Encounter: Payer: Self-pay | Admitting: Nurse Practitioner

## 2021-06-15 ENCOUNTER — Ambulatory Visit (INDEPENDENT_AMBULATORY_CARE_PROVIDER_SITE_OTHER): Payer: PPO | Admitting: Nurse Practitioner

## 2021-06-15 DIAGNOSIS — J069 Acute upper respiratory infection, unspecified: Secondary | ICD-10-CM

## 2021-06-15 MED ORDER — AMOXICILLIN-POT CLAVULANATE 875-125 MG PO TABS: 1.0000 | ORAL_TABLET | Freq: Two times a day (BID) | ORAL | 0 refills | Status: DC

## 2021-06-15 NOTE — Patient Instructions (Addendum)
URI:  Stay well hydrated  Stay active  Deep breathing exercises  May take tylenol for fever or pain  May take mucinex twice daily  May take Claritin   Will order Augmentin - concerned for sinusitis    Follow up:  Follow up if needed

## 2021-06-15 NOTE — Progress Notes (Signed)
Virtual Visit via Telephone Note  I connected with Rebecca Mata on 06/15/21 at  9:00 AM EST by telephone and verified that I am speaking with the correct person using two identifiers.  Location: Patient: home Provider: office   I discussed the limitations, risks, security and privacy concerns of performing an evaluation and management service by telephone and the availability of in person appointments. I also discussed with the patient that there may be a patient responsible charge related to this service. The patient expressed understanding and agreed to proceed.   History of Present Illness:  Sinuses over 1 week - head congestion, using flonase, no fever, feels like it's in the chest, thick post nasal drip    Negative covid test    Observations/Objective:  Vitals with BMI 03/22/2021 10/16/2020 09/18/2020  Height 5' 7.48" 5' 7.48" 5' 7.48"  Weight 218 lbs 13 oz 229 lbs 10 oz 236 lbs 3 oz  BMI 33.78 35.45 36.47  Systolic 102 139 269  Diastolic 68 74 83  Pulse 65 71 91      Assessment and Plan:  URI:  Stay well hydrated  Stay active  Deep breathing exercises  May take tylenol for fever or pain  May take mucinex twice daily  May take Claritin   Will order Augmentin - concerned for sinusitis    Follow up:  Follow up if needed     I discussed the assessment and treatment plan with the patient. The patient was provided an opportunity to ask questions and all were answered. The patient agreed with the plan and demonstrated an understanding of the instructions.   The patient was advised to call back or seek an in-person evaluation if the symptoms worsen or if the condition fails to improve as anticipated.  I provided 23 minutes of non-face-to-face time during this encounter.   Ivonne Andrew, NP

## 2021-06-25 ENCOUNTER — Other Ambulatory Visit: Payer: Self-pay | Admitting: Occupational Therapy

## 2021-06-25 ENCOUNTER — Other Ambulatory Visit: Payer: Self-pay

## 2021-06-25 NOTE — Patient Outreach (Signed)
Aging Gracefully Program  OT FINAL Visit  06/25/2021  Rebecca Mata [birth year] [id number]  Visit:  3- Third Visit  Start Time:  1400 End Time:  [id number] Total Minutes:  20  Readiness to Change:  Readiness to Change Score: 10   Durable Medical Equipment: Adaptive Equipment: Reacher Adaptive Equipment Distribution Date: 06/25/21  Patient Education: Education Provided: Yes Education Details: How to get up from a fall Person(s) Educated: Patient Comprehension: Verbalized Understanding  Goals:  Goals Addressed             This Visit's Progress    COMPLETED: Patient Stated       She would like to be able to pick up items from floor and reach up high to get items with more ease ( a reacher would help with this)  ACTION PLANNING Target Problem Area: Difficulty reaching down to pick items up off of floor  Why Problem May Occur: Balance issues, bending over to pick up items   Target Goal: Independence and ease of picking items up off the floor   STRATEGIES Modifying your home environment and making it safe: DO: DON'T:  Use reacher to get items that are on the ground and light weight Bend over to pick items up   Pick up too heavy objects with reacher--may break it.   Practice It is important to practice the strategies so we can determine if they will be effective in helping to reach your goal. Follow these specific recommendations: 1. Use reacher consistently to make sure it is working for you properly and as you need it to.  If a strategy does not work the first time, try it again and again (and maybe again). We may make some changes over the next few sessions, based on how they work.   Golden Circle, OTR/L      06/25/2021        Post Clinical Reasoning: Client Action (Goal) Three Interventions: A reacher was issued to client and she was shown how she could turn the end of it so that it is oriented in the direction she is trying to pick up the object. Did Client  Try?: Yes Targeted Problem Area Status: A Lot Better Clinician View Of Client Situation:: Rebecca Mata is doing well. She was happy to get the reacher. Client View Of His/Her Situation:: Rebecca Mata continues to be happy with her home modifications and is happy [facility] was able to help her as well as the services she received from the RN and the OT Next Visit Plan:: This was her last Aging Gracefully visit, I will drop off a Tips for Aging at [place] to her.

## 2021-06-25 NOTE — Patient Instructions (Signed)
he would like to be able to pick up items from floor and reach up high to get items with more ease ( a reacher would help with this)  ACTION PLANNING Target Problem Area: Difficulty reaching down to pick items up off of floor  Why Problem May Occur: Balance issues, bending over to pick up items   Target Goal: Independence and ease of picking items up off the floor   STRATEGIES Modifying your home environment and making it safe: DO: DON'T:  Use reacher to get items that are on the ground and light weight Bend over to pick items up   Pick up too heavy objects with reacher--may break it.    Practice It is important to practice the strategies so we can determine if they will be effective in helping to reach your goal. Follow these specific recommendations: 1. Use reacher consistently to make sure it is working for you properly and as you need it to.  If a strategy does not work the first time, try it again and again (and maybe again). We may make some changes over the next few sessions, based on how they work.   Golden Circle, OTR/L      06/25/2021

## 2021-07-07 ENCOUNTER — Other Ambulatory Visit: Payer: Self-pay

## 2021-07-07 NOTE — Patient Outreach (Signed)
Aging Gracefully Program ? ?07/07/2021 ? ?Rebecca Mata ?1955-11-18 ?[id number] ? ?[facility] Evaluation Interviewer made contact with patient. Aging Gracefully 5 month survey scheduled for 07/13/21 @ 11. ? ?[name] ?Care Management Assistant  ?[phone] ?

## 2021-07-13 ENCOUNTER — Other Ambulatory Visit: Payer: Self-pay

## 2021-07-13 NOTE — Patient Outreach (Signed)
Aging Gracefully Program ? ?07/13/2021 ? ?Rebecca Mata ?[birth year] ?[id number] ? ? ?[facility] Evaluation Interviewer made contact with patient. Aging Gracefully 5 month survey completed.  ? ?Interviewer will send referral to Rod Holler at [facility] for follow up. ? ?[name] ?Care Management Assistant  ?[phone] ?

## 2021-07-20 ENCOUNTER — Other Ambulatory Visit: Payer: Self-pay | Admitting: Occupational Therapy

## 2021-07-21 ENCOUNTER — Other Ambulatory Visit: Payer: Self-pay

## 2021-07-21 NOTE — Patient Instructions (Signed)
"  Tips for Aging at Home" booklet provided to her. ?

## 2021-07-21 NOTE — Patient Outreach (Signed)
Aging Gracefully Program ? ?OT FINAL Visit ? ?07/21/2021 ? ?Rebecca Mata ?1955/06/03 ?[id number] ? ?Visit:  4- Fourth Visit ? ?Start Time:  1430 ?End Time:  1500 ?Total Minutes:  30 ? ?Readiness to Change:  Readiness to Change Score: 10 ? ?Patient Education: ?Education Provided: Yes ?Education Details: "Tips for Aging at Home" book given to patient ?Person(s) Educated: Patient ?Comprehension: Verbalized Understanding (of there are tips in the book that may benefit her that we have not spoken of and other ones that she is already doing.) ? ?Post Clinical Reasoning: ?Clinician View Of Client Situation:: [name] has met all of her goals and now has all of her education material completed as well with booklet now provided. She has been a pleasure to work with and is wanting to Psychologist, occupational in the [employer]. ?Client View Of His/Her Situation:: [name] continues to stay busy inside and outside her home. She is so proud of her grandson who will graduate from [employer] in May of this year. She has enjoyed him staying with her while he has been going to college and is ready to get things moved back around in her house once again. ?Next Visit Plan:: This is acutally her last visit, I thought the last one was, but had one more piece of education to provide her. ? ?Golden Circle, OTR/L ?Aging Gracefully ?[phone] ? ? ?

## 2021-08-26 ENCOUNTER — Ambulatory Visit: Payer: PPO | Admitting: Internal Medicine

## 2021-09-08 NOTE — Progress Notes (Signed)
Erroneous encounter

## 2021-09-13 ENCOUNTER — Encounter: Payer: PPO | Admitting: Family

## 2021-09-13 DIAGNOSIS — R7303 Prediabetes: Secondary | ICD-10-CM

## 2021-11-30 ENCOUNTER — Other Ambulatory Visit: Payer: Self-pay

## 2021-11-30 NOTE — Patient Outreach (Signed)
Aging Gracefully Program  11/30/2021  ELIZET KAPLAN 1956/01/13 [id number]   [facility] Evaluation Interviewer made contact with patient. Aging Gracefully survey completed.     Vanice Sarah Care Management Assistant [phone]

## 2021-12-03 DIAGNOSIS — F17210 Nicotine dependence, cigarettes, uncomplicated: Secondary | ICD-10-CM | POA: Diagnosis not present

## 2021-12-03 DIAGNOSIS — E669 Obesity, unspecified: Secondary | ICD-10-CM | POA: Diagnosis not present

## 2021-12-03 DIAGNOSIS — E785 Hyperlipidemia, unspecified: Secondary | ICD-10-CM | POA: Diagnosis not present

## 2021-12-22 ENCOUNTER — Other Ambulatory Visit: Payer: Self-pay | Admitting: Family

## 2021-12-22 DIAGNOSIS — Z716 Tobacco abuse counseling: Secondary | ICD-10-CM

## 2021-12-27 ENCOUNTER — Other Ambulatory Visit: Payer: Self-pay | Admitting: Family

## 2021-12-27 ENCOUNTER — Other Ambulatory Visit: Payer: Self-pay

## 2021-12-27 DIAGNOSIS — Z716 Tobacco abuse counseling: Secondary | ICD-10-CM

## 2021-12-27 MED ORDER — BUPROPION HCL ER (SR) 150 MG PO TB12: ORAL_TABLET | ORAL | 2 refills | Status: AC

## 2022-04-21 ENCOUNTER — Ambulatory Visit (INDEPENDENT_AMBULATORY_CARE_PROVIDER_SITE_OTHER): Payer: PPO

## 2022-04-21 VITALS — Ht 68.75 in | Wt 217.0 lb

## 2022-04-21 DIAGNOSIS — Z0001 Encounter for general adult medical examination with abnormal findings: Secondary | ICD-10-CM | POA: Diagnosis not present

## 2022-04-21 DIAGNOSIS — F17210 Nicotine dependence, cigarettes, uncomplicated: Secondary | ICD-10-CM

## 2022-04-21 DIAGNOSIS — Z0000 Encounter for general adult medical examination without abnormal findings: Secondary | ICD-10-CM

## 2022-04-21 DIAGNOSIS — F17200 Nicotine dependence, unspecified, uncomplicated: Secondary | ICD-10-CM

## 2022-04-21 NOTE — Progress Notes (Signed)
I connected with Rebecca Mata today by telephone and verified that I am speaking with the correct person using two identifiers. Location patient: home Location provider: work Persons participating in the virtual visit: Khani, Precht LPN.   I discussed the limitations, risks, security and privacy concerns of performing an evaluation and management service by telephone and the availability of in person appointments. I also discussed with the patient that there may be a patient responsible charge related to this service. The patient expressed understanding and verbally consented to this telephonic visit.    Interactive audio and video telecommunications were attempted between this provider and patient, however failed, due to patient having technical difficulties OR patient did not have access to video capability.  We continued and completed visit with audio only.     Vital signs may be patient reported or missing.   Subjective:   Rebecca Mata is a 66 y.o. female who presents for Medicare Annual (Subsequent) preventive examination.  Review of Systems     Cardiac Risk Factors include: advanced age (>59men, >74 women);dyslipidemia;smoking/ tobacco exposure     Objective:    Today's Vitals   04/21/22 1357  Weight: 217 lb (98.4 kg)  Height: 5' 8.75" (1.746 m)   Body mass index is 32.28 kg/m.     04/21/2022    2:04 PM  Advanced Directives  Does Patient Have a Medical Advance Directive? No    Current Medications (verified) Outpatient Encounter Medications as of 04/21/2022  Medication Sig   buPROPion (WELLBUTRIN SR) 150 MG 12 hr tablet TAKE 1 TABLET (150 MG TOTAL) BY MOUTH TWICE (2 TIMES) DAILY   Cholecalciferol (VITAMIN D3) 1.25 MG (50000 UT) TABS TAKE 1 TABLET BY MOUTH EVERY 7 DAYS FOR 12 DOSES   amoxicillin-clavulanate (AUGMENTIN) 875-125 MG tablet Take 1 tablet by mouth 2 (two) times daily. (Patient not taking: Reported on 04/21/2022)   atorvastatin (LIPITOR)  40 MG tablet Take 1 tablet (40 mg total) by mouth daily. (Patient not taking: Reported on 01/13/2021)   Cholecalciferol (VITAMIN D3) 125 MCG (5000 UT) CAPS TAKE 1 TABLET BY MOUTH EVERY 7 (SEVEN) DAYS FOR 12 DOSES. (Patient not taking: Reported on 04/21/2022)   No facility-administered encounter medications on file as of 04/21/2022.    Allergies (verified) Codeine   History: Past Medical History:  Diagnosis Date   Cataract    GERD (gastroesophageal reflux disease)    Hyperlipidemia    Past Surgical History:  Procedure Laterality Date   CHOLECYSTECTOMY     TUBAL LIGATION     Family History  Problem Relation Age of Onset   Heart disease Mother    Stroke Mother    Brain cancer Father    Diabetes Sister    Social History   Socioeconomic History   Marital status: Widowed    Spouse name: Not on file   Number of children: 2   Years of education: Not on file   Highest education level: Not on file  Occupational History   Not on file  Tobacco Use   Smoking status: Every Day    Packs/day: 0.50    Years: 42.00    Total pack years: 21.00    Types: Cigarettes   Smokeless tobacco: Never  Vaping Use   Vaping Use: Never used  Substance and Sexual Activity   Alcohol use: Not Currently    Alcohol/week: 0.0 standard drinks of alcohol   Drug use: Never   Sexual activity: Not Currently    Birth control/protection: Post-menopausal  Other Topics Concern   Not on file  Social History Narrative   Not on file   Social Determinants of Health   Financial Resource Strain: Low Risk  (04/21/2022)   Overall Financial Resource Strain (CARDIA)    Difficulty of Paying Living Expenses: Not hard at all  Food Insecurity: No Food Insecurity (04/21/2022)   Hunger Vital Sign    Worried About Running Out of Food in the Last Year: Never true    Ran Out of Food in the Last Year: Never true  Transportation Needs: No Transportation Needs (04/21/2022)   PRAPARE - Scientist, research (physical sciences) (Medical): No    Lack of Transportation (Non-Medical): No  Physical Activity: Inactive (04/21/2022)   Exercise Vital Sign    Days of Exercise per Week: 0 days    Minutes of Exercise per Session: 0 min  Stress: No Stress Concern Present (04/21/2022)   [employer] of Occupational Health - Occupational Stress Questionnaire    Feeling of Stress : Not at all  Social Connections: Not on file    Tobacco Counseling Ready to quit: Yes Counseling given: Not Answered   Clinical Intake:  Pre-visit preparation completed: No  Pain : No/denies pain     Nutritional Status: BMI > 30  Obese Nutritional Risks: None Diabetes: No  How often do you need to have someone help you when you read instructions, pamphlets, or other written materials from your doctor or pharmacy?: 1 - Never  Diabetic? no  Interpreter Needed?: No  Information entered by :: NAllen LPN   Activities of Daily Living    04/21/2022    2:05 PM  In your present state of health, do you have any difficulty performing the following activities:  Hearing? 0  Vision? 0  Difficulty concentrating or making decisions? 0  Walking or climbing stairs? 0  Dressing or bathing? 0  Doing errands, shopping? 0  Preparing Food and eating ? N  Using the Toilet? N  In the past six months, have you accidently leaked urine? Y  Do you have problems with loss of bowel control? N  Managing your Medications? N  Managing your Finances? N  Housekeeping or managing your Housekeeping? N    Patient Care Team: Rema Fendt, NP as PCP - General (Nurse Practitioner)  Indicate any recent Medical Services you may have received from other than [facility] providers in the past year (date may be approximate).     Assessment:   This is a routine wellness examination for Rebecca Mata.  Hearing/Vision screen Vision Screening - Comments:: No regular eye exams,  Dietary issues and exercise activities discussed: Current Exercise  Habits: The patient does not participate in regular exercise at present   Goals Addressed             This Visit's Progress    Patient Stated       04/21/2022, wants to get weight done       Depression Screen    04/21/2022    2:05 PM 11/30/2021    3:56 PM 07/13/2021   11:03 AM 03/22/2021   10:25 AM 01/13/2021    1:04 PM 12/18/2020   10:04 AM 10/16/2020   11:32 AM  PHQ 2/9 Scores  PHQ - 2 Score 0 0 0 0 0 0 0  PHQ- 9 Score       0    Fall Risk    04/21/2022    2:05 PM 01/05/2021    8:34 PM 09/18/2020  8:40 AM  Fall Risk   Falls in the past year? 0 0 0  Number falls in past yr: 0 0 0  Injury with Fall? 0 0 0  Risk for fall due to : Medication side effect  No Fall Risks  Follow up Falls prevention discussed;Education provided;Falls evaluation completed  Falls evaluation completed    FALL RISK PREVENTION PERTAINING TO THE HOME:  Any stairs in or around the home? Yes  If so, are there any without handrails? No  Home free of loose throw rugs in walkways, pet beds, electrical cords, etc? Yes  Adequate lighting in your home to reduce risk of falls? Yes   ASSISTIVE DEVICES UTILIZED TO PREVENT FALLS:  Life alert? No  Use of a cane, walker or w/c? No  Grab bars in the bathroom? Yes  Shower chair or bench in shower? Yes  Elevated toilet seat or a handicapped toilet? Yes   TIMED UP AND GO:  Was the test performed? No .  .     Cognitive Function:        04/21/2022    2:07 PM  6CIT Screen  What Year? 0 points  What month? 0 points  What time? 0 points  Count back from 20 0 points  Months in reverse 0 points  Repeat phrase 2 points  Total Score 2 points    Immunizations  There is no immunization history on file for this patient.  TDAP status: Due, Education has been provided regarding the importance of this vaccine. Advised may receive this vaccine at local pharmacy or Health Dept. Aware to provide a copy of the vaccination record if obtained from local  pharmacy or Health Dept. Verbalized acceptance and understanding.  Flu Vaccine status: Declined, Education has been provided regarding the importance of this vaccine but patient still declined. Advised may receive this vaccine at local pharmacy or Health Dept. Aware to provide a copy of the vaccination record if obtained from local pharmacy or Health Dept. Verbalized acceptance and understanding.  Pneumococcal vaccine status: Declined,  Education has been provided regarding the importance of this vaccine but patient still declined. Advised may receive this vaccine at local pharmacy or Health Dept. Aware to provide a copy of the vaccination record if obtained from local pharmacy or Health Dept. Verbalized acceptance and understanding.   Covid-19 vaccine status: Completed vaccines  Qualifies for Shingles Vaccine? Yes   Zostavax completed No   Shingrix Completed?: No.    Education has been provided regarding the importance of this vaccine. Patient has been advised to call insurance company to determine out of pocket expense if they have not yet received this vaccine. Advised may also receive vaccine at local pharmacy or Health Dept. Verbalized acceptance and understanding.  Screening Tests Health Maintenance  Topic Date Due   Medicare Annual Wellness (AWV)  Never done   COVID-19 Vaccine (1) Never done   Pneumonia Vaccine 42+ Years old (1 - PCV) Never done   DTaP/Tdap/Td (1 - Tdap) Never done   COLONOSCOPY (Pts 45-44yrs Insurance coverage will need to be confirmed)  Never done   Lung Cancer Screening  Never done   Zoster Vaccines- Shingrix (1 of 2) Never done   INFLUENZA VACCINE  Never done   MAMMOGRAM  11/27/2022   DEXA SCAN  Completed   Hepatitis C Screening  Completed   HPV VACCINES  Aged Out    Health Maintenance  Health Maintenance Due  Topic Date Due   Medicare Annual Wellness (AWV)  Never done   COVID-19 Vaccine (1) Never done   Pneumonia Vaccine 70+ Years old (1 - PCV) Never  done   DTaP/Tdap/Td (1 - Tdap) Never done   COLONOSCOPY (Pts 45-75yrs Insurance coverage will need to be confirmed)  Never done   Lung Cancer Screening  Never done   Zoster Vaccines- Shingrix (1 of 2) Never done   INFLUENZA VACCINE  Never done    Colorectal cancer screening: Type of screening: Cologuard. Completed 10/05/2020. Repeat every 3 years  Mammogram status: patient to schedule  Bone Density status: Completed 11/05/2020.   Lung Cancer Screening: (Low Dose CT Chest recommended if Age 27-80 years, 30 pack-year currently smoking OR have quit w/in 15years.) does qualify.   Lung Cancer Screening Referral: ordered today  Additional Screening:  Hepatitis C Screening: does qualify; Completed 09/18/2020  Vision Screening: Recommended annual ophthalmology exams for early detection of glaucoma and other disorders of the eye. Is the patient up to date with their annual eye exam?  No  Who is the provider or what is the name of the office in which the patient attends annual eye exams? [facility] If pt is not established with a provider, would they like to be referred to a provider to establish care? No .   Dental Screening: Recommended annual dental exams for proper oral hygiene  Community Resource Referral / Chronic Care Management: CRR required this visit?  No   CCM required this visit?  No      Plan:     I have personally reviewed and noted the following in the patient's chart:   Medical and social history Use of alcohol, tobacco or illicit drugs  Current medications and supplements including opioid prescriptions. Patient is not currently taking opioid prescriptions. Functional ability and status Nutritional status Physical activity Advanced directives List of other physicians Hospitalizations, surgeries, and ER visits in previous 12 months Vitals Screenings to include cognitive, depression, and falls Referrals and appointments  In addition, I have reviewed and  discussed with patient certain preventive protocols, quality metrics, and best practice recommendations. A written personalized care plan for preventive services as well as general preventive health recommendations were provided to patient.     Barb Merino, LPN   [id number]   Nurse Notes: none  Due to this being a virtual visit, the after visit summary with patients personalized plan was offered to patient via mail or my-chart.  Patient would like to access on my-chart

## 2022-04-21 NOTE — Addendum Note (Signed)
Addended by: Barb Merino on: 04/21/2022 02:22 PM   Modules accepted: Orders

## 2022-04-21 NOTE — Patient Instructions (Signed)
Rebecca Mata , Thank you for taking time to come for your Medicare Wellness Visit. I appreciate your ongoing commitment to your health goals. Please review the following plan we discussed and let me know if I can assist you in the future.   These are the goals we discussed:  Goals      Patient Stated     04/21/2022, wants to get weight done        This is a list of the screening recommended for you and due dates:  Health Maintenance  Topic Date Due   COVID-19 Vaccine (1) Never done   Pneumonia Vaccine (1 - PCV) Never done   DTaP/Tdap/Td vaccine (1 - Tdap) Never done   Colon Cancer Screening  Never done   Screening for Lung Cancer  Never done   Zoster (Shingles) Vaccine (1 of 2) Never done   Flu Shot  Never done   Mammogram  11/27/2022   Medicare Annual Wellness Visit  04/22/2023   DEXA scan (bone density measurement)  Completed   Hepatitis C Screening: USPSTF Recommendation to screen - Ages 6-79 yo.  Completed   HPV Vaccine  Aged Out    Advanced directives: Advance directive discussed with you today. Even though you declined this today please call our office should you change your mind and we can give you the proper paperwork for you to fill out.  Conditions/risks identified: smoking  Next appointment: Follow up in one year for your annual wellness visit    Preventive Care 65 Years and Older, Female Preventive care refers to lifestyle choices and visits with your health care provider that can promote health and wellness. What does preventive care include? A yearly physical exam. This is also called an annual well check. Dental exams once or twice a year. Routine eye exams. Ask your health care provider how often you should have your eyes checked. Personal lifestyle choices, including: Daily care of your teeth and gums. Regular physical activity. Eating a healthy diet. Avoiding tobacco and drug use. Limiting alcohol use. Practicing safe sex. Taking low-dose aspirin  every day. Taking vitamin and mineral supplements as recommended by your health care provider. What happens during an annual well check? The services and screenings done by your health care provider during your annual well check will depend on your age, overall health, lifestyle risk factors, and family history of disease. Counseling  Your health care provider may ask you questions about your: Alcohol use. Tobacco use. Drug use. Emotional well-being. Home and relationship well-being. Sexual activity. Eating habits. History of falls. Memory and ability to understand (cognition). Work and work Astronomer. Reproductive health. Screening  You may have the following tests or measurements: Height, weight, and BMI. Blood pressure. Lipid and cholesterol levels. These may be checked every 5 years, or more frequently if you are over 6 years old. Skin check. Lung cancer screening. You may have this screening every year starting at age 64 if you have a 30-pack-year history of smoking and currently smoke or have quit within the past 15 years. Fecal occult blood test (FOBT) of the stool. You may have this test every year starting at age 79. Flexible sigmoidoscopy or colonoscopy. You may have a sigmoidoscopy every 5 years or a colonoscopy every 10 years starting at age 72. Hepatitis C blood test. Hepatitis B blood test. Sexually transmitted disease (STD) testing. Diabetes screening. This is done by checking your blood sugar (glucose) after you have not eaten for a while (fasting). You may  have this done every 1-3 years. Bone density scan. This is done to screen for osteoporosis. You may have this done starting at age 42. Mammogram. This may be done every 1-2 years. Talk to your health care provider about how often you should have regular mammograms. Talk with your health care provider about your test results, treatment options, and if necessary, the need for more tests. Vaccines  Your health  care provider may recommend certain vaccines, such as: Influenza vaccine. This is recommended every year. Tetanus, diphtheria, and acellular pertussis (Tdap, Td) vaccine. You may need a Td booster every 10 years. Zoster vaccine. You may need this after age 59. Pneumococcal 13-valent conjugate (PCV13) vaccine. One dose is recommended after age 6. Pneumococcal polysaccharide (PPSV23) vaccine. One dose is recommended after age 56. Talk to your health care provider about which screenings and vaccines you need and how often you need them. This information is not intended to replace advice given to you by your health care provider. Make sure you discuss any questions you have with your health care provider. Document Released: 05/22/2015 Document Revised: 01/13/2016 Document Reviewed: 02/24/2015 Elsevier Interactive Patient Education  2017 [employer] Prevention in the Home Falls can cause injuries. They can happen to people of all ages. There are many things you can do to make your home safe and to help prevent falls. What can I do on the outside of my home? Regularly fix the edges of walkways and driveways and fix any cracks. Remove anything that might make you trip as you walk through a door, such as a raised step or threshold. Trim any bushes or trees on the path to your home. Use bright outdoor lighting. Clear any walking paths of anything that might make someone trip, such as rocks or tools. Regularly check to see if handrails are loose or broken. Make sure that both sides of any steps have handrails. Any raised decks and porches should have guardrails on the edges. Have any leaves, snow, or ice cleared regularly. Use sand or salt on walking paths during winter. Clean up any spills in your garage right away. This includes oil or grease spills. What can I do in the bathroom? Use night lights. Install grab bars by the toilet and in the tub and shower. Do not use towel bars as grab  bars. Use non-skid mats or decals in the tub or shower. If you need to sit down in the shower, use a plastic, non-slip stool. Keep the floor dry. Clean up any water that spills on the floor as soon as it happens. Remove soap buildup in the tub or shower regularly. Attach bath mats securely with double-sided non-slip rug tape. Do not have throw rugs and other things on the floor that can make you trip. What can I do in the bedroom? Use night lights. Make sure that you have a light by your bed that is easy to reach. Do not use any sheets or blankets that are too big for your bed. They should not hang down onto the floor. Have a firm chair that has side arms. You can use this for support while you get dressed. Do not have throw rugs and other things on the floor that can make you trip. What can I do in the kitchen? Clean up any spills right away. Avoid walking on wet floors. Keep items that you use a lot in easy-to-reach places. If you need to reach something above you, use a strong step  stool that has a grab bar. Keep electrical cords out of the way. Do not use floor polish or wax that makes floors slippery. If you must use wax, use non-skid floor wax. Do not have throw rugs and other things on the floor that can make you trip. What can I do with my stairs? Do not leave any items on the stairs. Make sure that there are handrails on both sides of the stairs and use them. Fix handrails that are broken or loose. Make sure that handrails are as long as the stairways. Check any carpeting to make sure that it is firmly attached to the stairs. Fix any carpet that is loose or worn. Avoid having throw rugs at the top or bottom of the stairs. If you do have throw rugs, attach them to the floor with carpet tape. Make sure that you have a light switch at the top of the stairs and the bottom of the stairs. If you do not have them, ask someone to add them for you. What else can I do to help prevent  falls? Wear shoes that: Do not have high heels. Have rubber bottoms. Are comfortable and fit you well. Are closed at the toe. Do not wear sandals. If you use a stepladder: Make sure that it is fully opened. Do not climb a closed stepladder. Make sure that both sides of the stepladder are locked into place. Ask someone to hold it for you, if possible. Clearly mark and make sure that you can see: Any grab bars or handrails. First and last steps. Where the edge of each step is. Use tools that help you move around (mobility aids) if they are needed. These include: Canes. Walkers. Scooters. Crutches. Turn on the lights when you go into a dark area. Replace any light bulbs as soon as they burn out. Set up your furniture so you have a clear path. Avoid moving your furniture around. If any of your floors are uneven, fix them. If there are any pets around you, be aware of where they are. Review your medicines with your doctor. Some medicines can make you feel dizzy. This can increase your chance of falling. Ask your doctor what other things that you can do to help prevent falls. This information is not intended to replace advice given to you by your health care provider. Make sure you discuss any questions you have with your health care provider. Document Released: 02/19/2009 Document Revised: 10/01/2015 Document Reviewed: 05/30/2014 Elsevier Interactive Patient Education  2017 [employer].

## 2022-08-09 IMAGING — MG MM DIGITAL SCREENING BILAT W/ TOMO AND CAD
8 series · 8 of 24 positions shown · non-contrast
Comparison: None.

ACR Breast Density Category a: The breast tissue is almost entirely
fatty.

CLINICAL DATA: Screening.

EXAM:
DIGITAL SCREENING BILATERAL MAMMOGRAM WITH TOMOSYNTHESIS AND CAD
TECHNIQUE: Bilateral screening digital craniocaudal and mediolateral oblique
mammograms were obtained. Bilateral screening digital breast
tomosynthesis was performed. The images were evaluated with
computer-aided detection.

[R CC synth-2D]
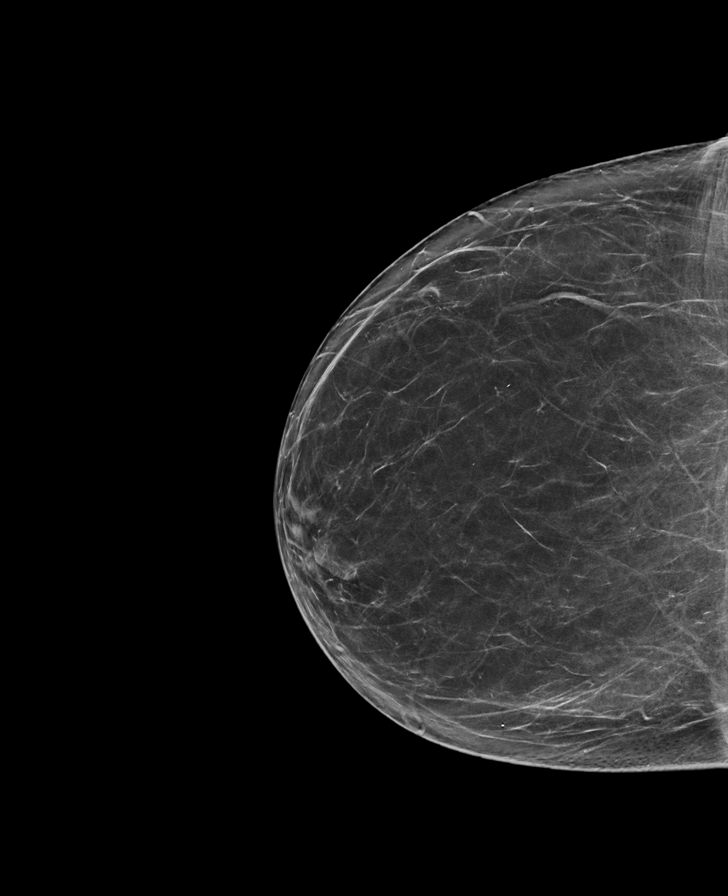

[L MLO synth-2D]
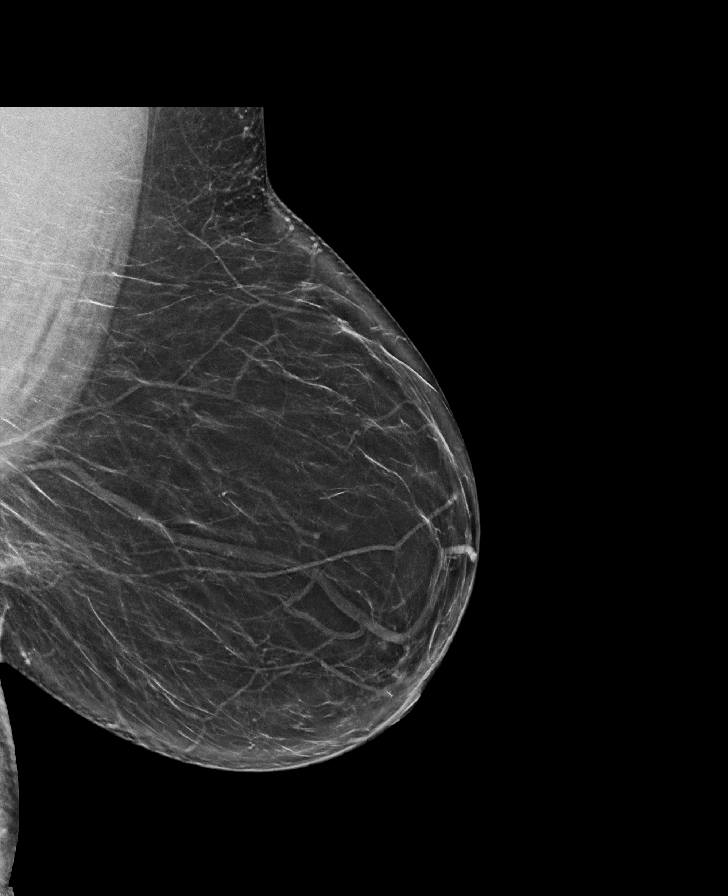

[R MLO synth-2D]
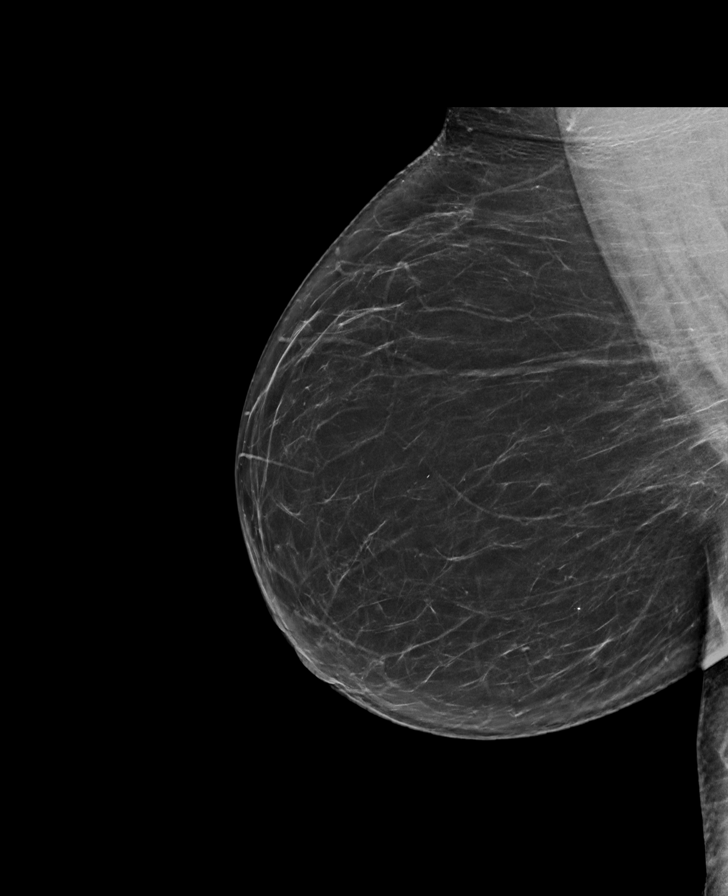

[L CC synth-2D]
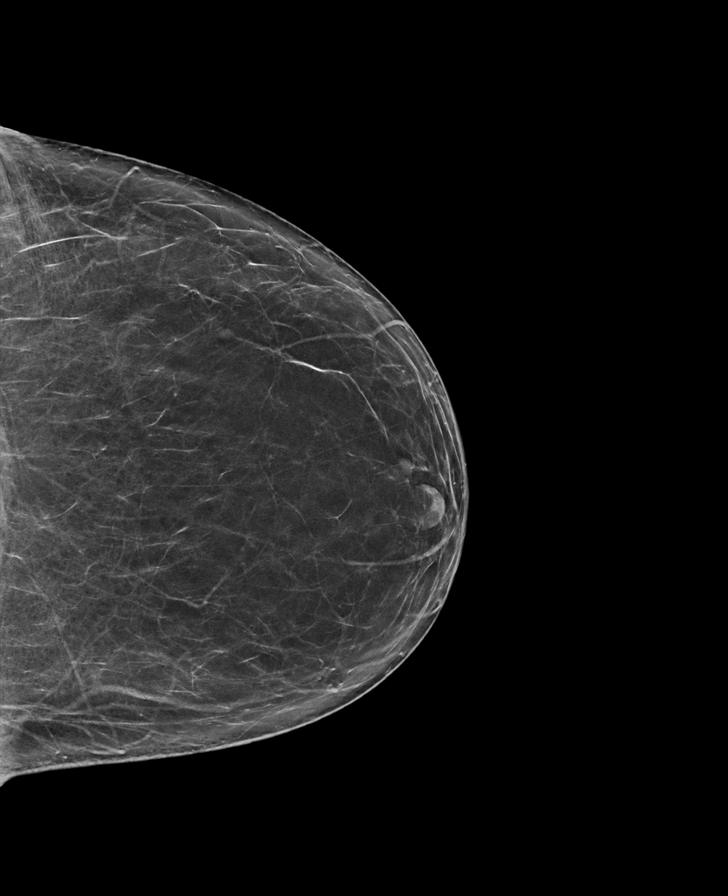

[R CC tomo · tomo slice 35/70.0]
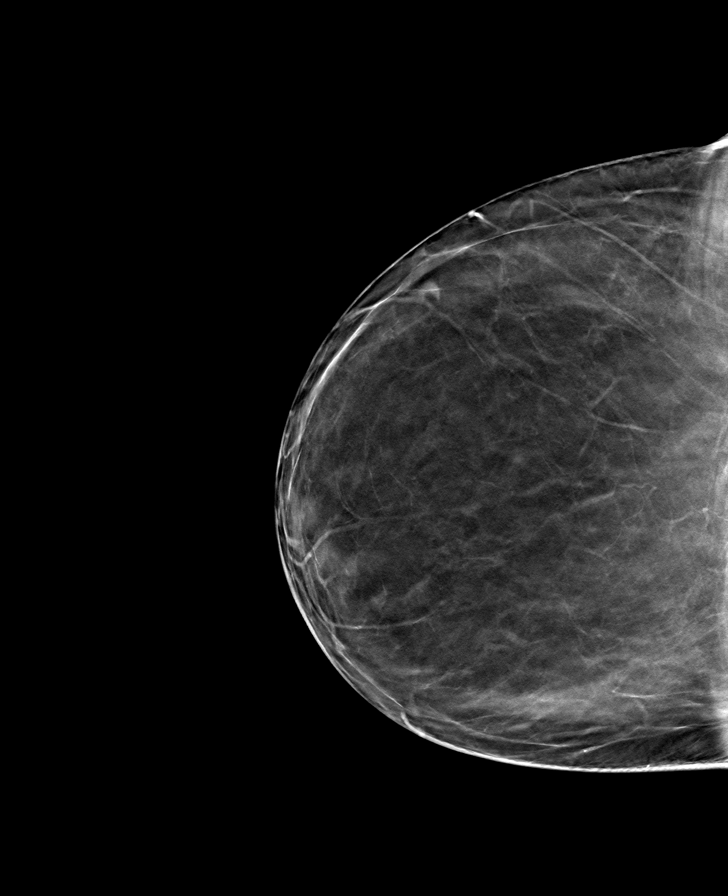

[R MLO tomo · tomo slice 39/76.0]
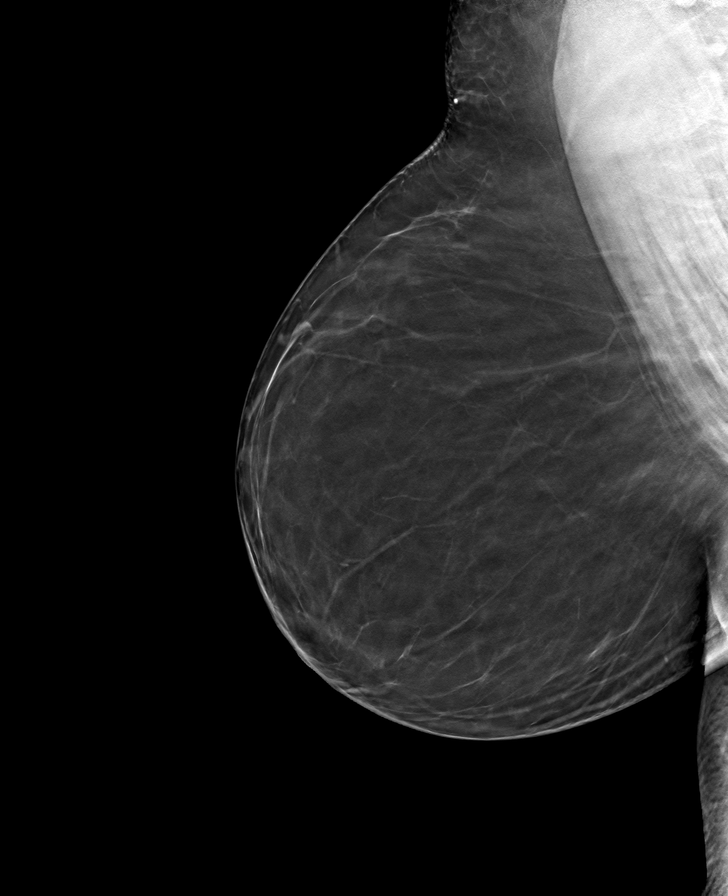

[L MLO tomo · tomo slice 37/74.0]
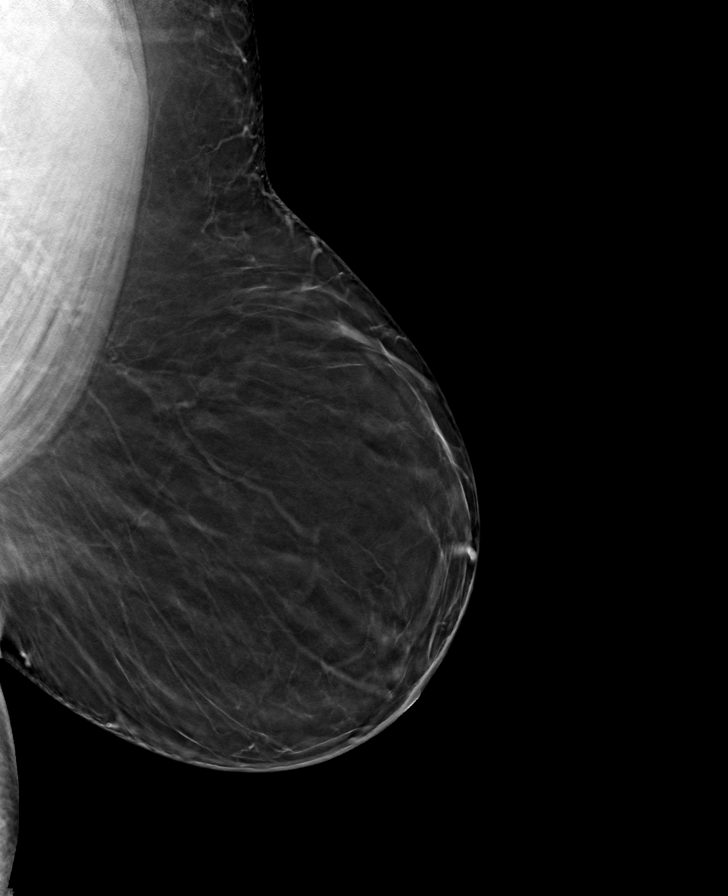

[L CC tomo · tomo slice 35/68.0]
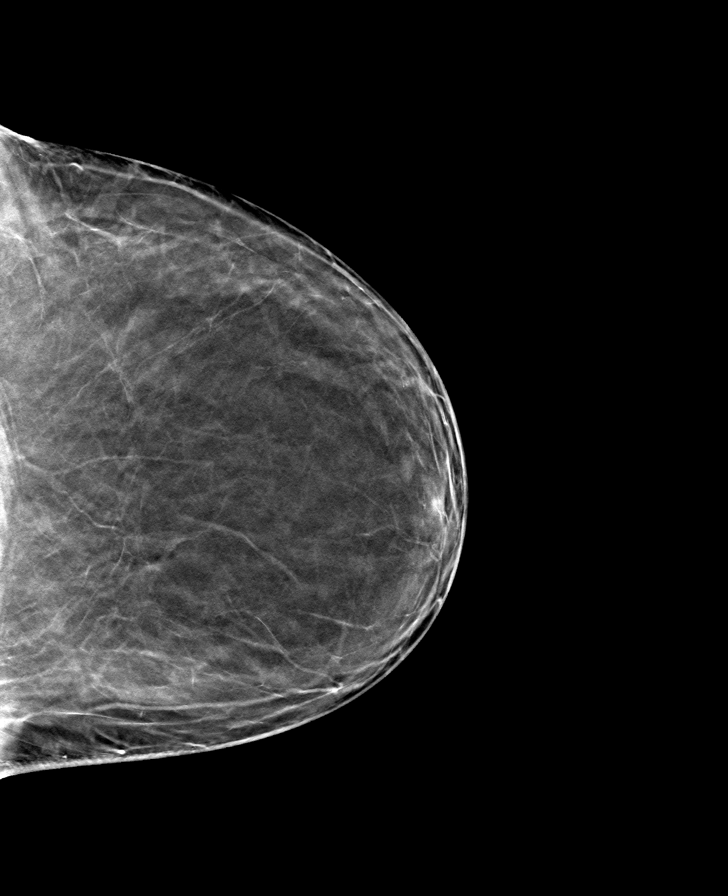

[8 of 24 positions shown; findings below may reference images not displayed]

FINDINGS: There are no findings suspicious for malignancy.
IMPRESSION: No mammographic evidence of malignancy. A result letter of this
screening mammogram will be mailed directly to the patient.

RECOMMENDATION:
Screening mammogram in one year. (Code:44-M-M6Q)

BI-RADS CATEGORY  1: Negative.

## 2023-01-12 DIAGNOSIS — F17210 Nicotine dependence, cigarettes, uncomplicated: Secondary | ICD-10-CM | POA: Diagnosis not present

## 2023-01-12 DIAGNOSIS — E785 Hyperlipidemia, unspecified: Secondary | ICD-10-CM | POA: Diagnosis not present

## 2023-01-12 DIAGNOSIS — E669 Obesity, unspecified: Secondary | ICD-10-CM | POA: Diagnosis not present

## 2023-02-15 ENCOUNTER — Ambulatory Visit: Payer: PPO

## 2023-02-15 DIAGNOSIS — Z0000 Encounter for general adult medical examination without abnormal findings: Secondary | ICD-10-CM

## 2023-02-15 NOTE — Progress Notes (Addendum)
Subjective:   Rebecca Mata is a 67 y.o. female who presents for Medicare Annual (Subsequent) preventive examination.  Visit Complete: Virtual I connected with  Rebecca Mata on 02/15/23 by a audio enabled telemedicine application and verified that I am speaking with the correct person using two identifiers.  Patient Location: Home  Provider Location: Office/Clinic  I discussed the limitations of evaluation and management by telemedicine. The patient expressed understanding and agreed to proceed.  Patient Medicare AWV questionnaire was completed by patient on 02/14/2023; I have confirmed that all information answered by patient is correct and no changes since this date.  Vital Signs: Because this visit was a virtual/telehealth visit, some criteria may be missing or patient reported. Any vitals not documented were not able to be obtained and vitals that have been documented are patient reported.    Cardiac Risk Factors include: advanced age (>76men, >4 women);dyslipidemia     Objective:    Today's Vitals   There is no height or weight on file to calculate BMI.     02/15/2023    4:35 PM 04/21/2022    2:04 PM  Advanced Directives  Does Patient Have a Medical Advance Directive? No No    Current Medications (verified) Outpatient Encounter Medications as of 02/15/2023  Medication Sig   Cholecalciferol (VITAMIN D3) 1.25 MG (50000 UT) TABS TAKE 1 TABLET BY MOUTH EVERY 7 DAYS FOR 12 DOSES   amoxicillin-clavulanate (AUGMENTIN) 875-125 MG tablet Take 1 tablet by mouth 2 (two) times daily. (Patient not taking: Reported on 04/21/2022)   atorvastatin (LIPITOR) 40 MG tablet Take 1 tablet (40 mg total) by mouth daily. (Patient not taking: Reported on 01/13/2021)   buPROPion (WELLBUTRIN SR) 150 MG 12 hr tablet TAKE 1 TABLET (150 MG TOTAL) BY MOUTH TWICE (2 TIMES) DAILY (Patient not taking: Reported on 02/15/2023)   Cholecalciferol (VITAMIN D3) 125 MCG (5000 UT) CAPS TAKE 1 TABLET BY MOUTH  EVERY 7 (SEVEN) DAYS FOR 12 DOSES. (Patient not taking: Reported on 04/21/2022)   No facility-administered encounter medications on file as of 02/15/2023.    Allergies (verified) Codeine   History: Past Medical History:  Diagnosis Date   Cataract    GERD (gastroesophageal reflux disease)    Hyperlipidemia    Past Surgical History:  Procedure Laterality Date   CHOLECYSTECTOMY     EYE SURGERY     Cataracts   TUBAL LIGATION     Family History  Problem Relation Age of Onset   Heart disease Mother    Stroke Mother    Diabetes Mother    Brain cancer Father    Diabetes Sister    Social History   Socioeconomic History   Marital status: Widowed    Spouse name: Not on file   Number of children: 2   Years of education: Not on file   Highest education level: Not on file  Occupational History   Not on file  Tobacco Use   Smoking status: Every Day    Current packs/day: 0.50    Average packs/day: 0.5 packs/day for 42.0 years (21.0 ttl pk-yrs)    Types: Cigarettes   Smokeless tobacco: Never  Vaping Use   Vaping status: Never Used  Substance and Sexual Activity   Alcohol use: Not Currently   Drug use: Never   Sexual activity: Not Currently    Birth control/protection: Post-menopausal  Other Topics Concern   Not on file  Social History Narrative   Not on file   Social Determinants  of Health   Financial Resource Strain: Low Risk  (02/15/2023)   Overall Financial Resource Strain (CARDIA)    Difficulty of Paying Living Expenses: Not hard at all  Food Insecurity: No Food Insecurity (02/15/2023)   Hunger Vital Sign    Worried About Running Out of Food in the Last Year: Never true    Ran Out of Food in the Last Year: Never true  Transportation Needs: No Transportation Needs (02/15/2023)   PRAPARE - Administrator, Civil Service (Medical): No    Lack of Transportation (Non-Medical): No  Physical Activity: Sufficiently Active (02/15/2023)   Exercise Vital Sign     Days of Exercise per Week: 3 days    Minutes of Exercise per Session: 90 min  Stress: No Stress Concern Present (02/15/2023)   [employer] of Occupational Health - Occupational Stress Questionnaire    Feeling of Stress : Not at all  Social Connections: Moderately Isolated (02/15/2023)   Social Connection and Isolation Panel [NHANES]    Frequency of Communication with Friends and Family: More than three times a week    Frequency of Social Gatherings with Friends and Family: More than three times a week    Attends Religious Services: More than 4 times per year    Active Member of [employer] or Organizations: No    Attends Banker Meetings: Never    Marital Status: Widowed    Tobacco Counseling Ready to quit: Yes Counseling given: Not Answered   Clinical Intake:  Pre-visit preparation completed: Yes  Pain : No/denies pain     Nutritional Risks: None Diabetes: No  How often do you need to have someone help you when you read instructions, pamphlets, or other written materials from your doctor or pharmacy?: 1 - Never  Interpreter Needed?: No  Information entered by :: NAllen LPN   Activities of Daily Living    02/15/2023    4:26 PM 02/14/2023    1:17 PM  In your present state of health, do you have any difficulty performing the following activities:  Hearing? 0 0  Vision? 0 0  Difficulty concentrating or making decisions? 0 0  Walking or climbing stairs? 0 0  Dressing or bathing? 0 0  Doing errands, shopping? 0 0  Preparing Food and eating ? N N  Using the Toilet? N N  In the past six months, have you accidently leaked urine? N N  Do you have problems with loss of bowel control? N N  Managing your Medications? N N  Managing your Finances? N N  Housekeeping or managing your Housekeeping? N N    Patient Care Team: Rema Fendt, NP as PCP - General (Nurse Practitioner)  Indicate any recent Medical Services you may have received from other than [facility]  providers in the past year (date may be approximate).     Assessment:   This is a routine wellness examination for Rebecca Mata.  Hearing/Vision screen Hearing Screening - Comments:: Denies hearing issues Vision Screening - Comments:: No regular eye exams   Goals Addressed             This Visit's Progress    Patient Stated       02/15/2023, wants to lose weight       Depression Screen    02/15/2023    4:37 PM 04/21/2022    2:05 PM 11/30/2021    3:56 PM 07/13/2021   11:03 AM 03/22/2021   10:25 AM 01/13/2021    1:04  PM 12/18/2020   10:04 AM  PHQ 2/9 Scores  PHQ - 2 Score 0 0 0 0 0 0 0    Fall Risk    02/14/2023    1:17 PM 04/21/2022    2:05 PM 01/05/2021    8:34 PM 09/18/2020    8:40 AM  Fall Risk   Falls in the past year? 0 0 0 0  Number falls in past yr: 0 0 0 0  Injury with Fall? 0 0 0 0  Risk for fall due to : No Fall Risks Medication side effect  No Fall Risks  Follow up Falls evaluation completed;Falls prevention discussed Falls prevention discussed;Education provided;Falls evaluation completed  Falls evaluation completed    MEDICARE RISK AT HOME: Medicare Risk at Home Any stairs in or around the home?: Yes If so, are there any without handrails?: No Home free of loose throw rugs in walkways, pet beds, electrical cords, etc?: Yes Adequate lighting in your home to reduce risk of falls?: Yes Life alert?: No Use of a cane, walker or w/c?: No Grab bars in the bathroom?: Yes Shower chair or bench in shower?: Yes Elevated toilet seat or a handicapped toilet?: Yes  TIMED UP AND GO:  Was the test performed?  No    Cognitive Function:        02/15/2023    4:38 PM 04/21/2022    2:07 PM  6CIT Screen  What Year? 0 points 0 points  What month? 0 points 0 points  What time? 0 points 0 points  Count back from 20 0 points 0 points  Months in reverse 0 points 0 points  Repeat phrase 0 points 2 points  Total Score 0 points 2 points    Immunizations  There is no  immunization history on file for this patient.  TDAP status: Due, Education has been provided regarding the importance of this vaccine. Advised may receive this vaccine at local pharmacy or Health Dept. Aware to provide a copy of the vaccination record if obtained from local pharmacy or Health Dept. Verbalized acceptance and understanding.  Flu Vaccine status: Declined, Education has been provided regarding the importance of this vaccine but patient still declined. Advised may receive this vaccine at local pharmacy or Health Dept. Aware to provide a copy of the vaccination record if obtained from local pharmacy or Health Dept. Verbalized acceptance and understanding.  Pneumococcal vaccine status: Declined,  Education has been provided regarding the importance of this vaccine but patient still declined. Advised may receive this vaccine at local pharmacy or Health Dept. Aware to provide a copy of the vaccination record if obtained from local pharmacy or Health Dept. Verbalized acceptance and understanding.   Covid-19 vaccine status: Completed vaccines  Qualifies for Shingles Vaccine? Yes   Zostavax completed No   Shingrix Completed?: No.    Education has been provided regarding the importance of this vaccine. Patient has been advised to call insurance company to determine out of pocket expense if they have not yet received this vaccine. Advised may also receive vaccine at local pharmacy or Health Dept. Verbalized acceptance and understanding.  Screening Tests Health Maintenance  Topic Date Due   DTaP/Tdap/Td (1 - Tdap) Never done   Colonoscopy  Never done   Lung Cancer Screening  Never done   MAMMOGRAM  11/27/2022   COVID-19 Vaccine (1 - 2023-24 season) Never done   Zoster Vaccines- Shingrix (1 of 2) 05/18/2023 (Originally 01/27/2005)   INFLUENZA VACCINE  08/07/2023 (Originally 12/08/2022)  Pneumonia Vaccine 39+ Years old (1 of 2 - PCV) 02/15/2024 (Originally 01/27/1962)   Medicare Annual  Wellness (AWV)  02/15/2024   DEXA SCAN  Completed   Hepatitis C Screening  Completed   HPV VACCINES  Aged Out    Health Maintenance  Health Maintenance Due  Topic Date Due   DTaP/Tdap/Td (1 - Tdap) Never done   Colonoscopy  Never done   Lung Cancer Screening  Never done   MAMMOGRAM  11/27/2022   COVID-19 Vaccine (1 - 2023-24 season) Never done    Colorectal cancer screening: Type of screening: Cologuard. Completed 10/05/2020. Repeat every 3 years  Mammogram status: declines at this time  Bone Density status: Completed 11/05/2020.   Lung Cancer Screening: (Low Dose CT Chest recommended if Age 71-80 years, 20 pack-year currently smoking OR have quit w/in 15years.) does not qualify.   Lung Cancer Screening Referral: no  Additional Screening:  Hepatitis C Screening: does qualify; Completed 09/18/2020  Vision Screening: Recommended annual ophthalmology exams for early detection of glaucoma and other disorders of the eye. Is the patient up to date with their annual eye exam?  No  Who is the provider or what is the name of the office in which the patient attends annual eye exams? none If pt is not established with a provider, would they like to be referred to a provider to establish care? No .   Dental Screening: Recommended annual dental exams for proper oral hygiene  Diabetic Foot Exam: n/a  Community Resource Referral / Chronic Care Management: CRR required this visit?  No   CCM required this visit?  No     Plan:     I have personally reviewed and noted the following in the patient's chart:   Medical and social history Use of alcohol, tobacco or illicit drugs  Current medications and supplements including opioid prescriptions. Patient is not currently taking opioid prescriptions. Functional ability and status Nutritional status Physical activity Advanced directives List of other physicians Hospitalizations, surgeries, and ER visits in previous 12  months Vitals Screenings to include cognitive, depression, and falls Referrals and appointments  In addition, I have reviewed and discussed with patient certain preventive protocols, quality metrics, and best practice recommendations. A written personalized care plan for preventive services as well as general preventive health recommendations were provided to patient.     Barb Merino, LPN   16/05/958   After Visit Summary: (MyChart) Due to this being a telephonic visit, the after visit summary with patients personalized plan was offered to patient via MyChart   Nurse Notes: none

## 2023-02-15 NOTE — Patient Instructions (Signed)
Rebecca Mata , Thank you for taking time to come for your Medicare Wellness Visit. I appreciate your ongoing commitment to your health goals. Please review the following plan we discussed and let me know if I can assist you in the future.   Referrals/Orders/Follow-Ups/Clinician Recommendations: none  This is a list of the screening recommended for you and due dates:  Health Maintenance  Topic Date Due   DTaP/Tdap/Td vaccine (1 - Tdap) Never done   Colon Cancer Screening  Never done   Screening for Lung Cancer  Never done   Mammogram  11/27/2022   COVID-19 Vaccine (1 - 2023-24 season) Never done   Zoster (Shingles) Vaccine (1 of 2) 05/18/2023*   Flu Shot  08/07/2023*   Pneumonia Vaccine (1 of 2 - PCV) 02/15/2024*   Medicare Annual Wellness Visit  02/15/2024   DEXA scan (bone density measurement)  Completed   Hepatitis C Screening  Completed   HPV Vaccine  Aged Out  *Topic was postponed. The date shown is not the original due date.    Advanced directives: (ACP Link)Information on Advanced Care Planning can be found at [facility] Advance Health Care Directives Advance Health Care Directives ([email or web address]   Next Medicare Annual Wellness Visit scheduled for next year: No, schedule not open for next year  Insert Preventive Care attachment Insert FALL PREVENTION attachment if needed

## 2023-03-28 ENCOUNTER — Ambulatory Visit (INDEPENDENT_AMBULATORY_CARE_PROVIDER_SITE_OTHER): Payer: PPO | Admitting: Family

## 2023-03-28 ENCOUNTER — Encounter: Payer: Self-pay | Admitting: Family

## 2023-03-28 VITALS — BP 126/72 | HR 72 | Temp 98.5°F | Ht 68.5 in | Wt 239.2 lb

## 2023-03-28 DIAGNOSIS — H9202 Otalgia, left ear: Secondary | ICD-10-CM | POA: Diagnosis not present

## 2023-03-28 MED ORDER — AMOXICILLIN-POT CLAVULANATE 875-125 MG PO TABS: 1.0000 | ORAL_TABLET | Freq: Two times a day (BID) | ORAL | 0 refills | Status: AC

## 2023-03-28 NOTE — Progress Notes (Signed)
Patient states she still has ear ache problems.

## 2023-03-28 NOTE — Progress Notes (Signed)
Patient ID: Rebecca Mata, female    DOB: 12-08-1955  MRN: [id number]  CC: Earache  Subjective: Clarence Greaver is a 67 y.o. female who presents for earache.    Her concerns today include:  Left earache x 4 days. States thinks began after having "cold air from fan blowing in ear" at grocery store. Denies red flag symptoms.    Patient Active Problem List   Diagnosis Date Noted   Left otitis media 04/05/2021   Vitamin D deficiency 09/20/2020   Hyperlipidemia 09/20/2020   Prediabetes 09/18/2020     Current Outpatient Medications on File Prior to Visit  Medication Sig Dispense Refill   Cholecalciferol (VITAMIN D3) 1.25 MG (50000 UT) TABS TAKE 1 TABLET BY MOUTH EVERY 7 DAYS FOR 12 DOSES 12 tablet o   atorvastatin (LIPITOR) 40 MG tablet Take 1 tablet (40 mg total) by mouth daily. (Patient not taking: Reported on 01/13/2021) 90 tablet 0   buPROPion (WELLBUTRIN SR) 150 MG 12 hr tablet TAKE 1 TABLET (150 MG TOTAL) BY MOUTH TWICE (2 TIMES) DAILY (Patient not taking: Reported on 02/15/2023) 60 tablet 2   Cholecalciferol (VITAMIN D3) 125 MCG (5000 UT) CAPS TAKE 1 TABLET BY MOUTH EVERY 7 (SEVEN) DAYS FOR 12 DOSES. (Patient not taking: Reported on 04/21/2022)     No current facility-administered medications on file prior to visit.    Allergies  Allergen Reactions   Codeine     Social History   Socioeconomic History   Marital status: Widowed    Spouse name: Not on file   Number of children: 2   Years of education: Not on file   Highest education level: Not on file  Occupational History   Not on file  Tobacco Use   Smoking status: Every Day    Current packs/day: 0.50    Average packs/day: 0.5 packs/day for 42.0 years (21.0 ttl pk-yrs)    Types: Cigarettes   Smokeless tobacco: Never  Vaping Use   Vaping status: Never Used  Substance and Sexual Activity   Alcohol use: Not Currently   Drug use: Never   Sexual activity: Not Currently    Birth control/protection: Post-menopausal   Other Topics Concern   Not on file  Social History Narrative   Not on file   Social Determinants of Health   Financial Resource Strain: Low Risk  (02/15/2023)   Overall Financial Resource Strain (CARDIA)    Difficulty of Paying Living Expenses: Not hard at all  Food Insecurity: No Food Insecurity (02/15/2023)   Hunger Vital Sign    Worried About Running Out of Food in the Last Year: Never true    Ran Out of Food in the Last Year: Never true  Transportation Needs: No Transportation Needs (02/15/2023)   PRAPARE - Administrator, Civil Service (Medical): No    Lack of Transportation (Non-Medical): No  Physical Activity: Sufficiently Active (02/15/2023)   Exercise Vital Sign    Days of Exercise per Week: 3 days    Minutes of Exercise per Session: 90 min  Stress: No Stress Concern Present (02/15/2023)   [employer] of Occupational Health - Occupational Stress Questionnaire    Feeling of Stress : Not at all  Social Connections: Moderately Isolated (02/15/2023)   Social Connection and Isolation Panel [NHANES]    Frequency of Communication with Friends and Family: More than three times a week    Frequency of Social Gatherings with Friends and Family: More than three times a week  Attends Religious Services: More than 4 times per year    Active Member of Clubs or Organizations: No    Attends Banker Meetings: Never    Marital Status: Widowed  Intimate Partner Violence: Not At Risk (02/15/2023)   Humiliation, Afraid, Rape, and Kick questionnaire    Fear of Current or Ex-Partner: No    Emotionally Abused: No    Physically Abused: No    Sexually Abused: No    Family History  Problem Relation Age of Onset   Heart disease Mother    Stroke Mother    Diabetes Mother    Brain cancer Father    Diabetes Sister     Past Surgical History:  Procedure Laterality Date   CHOLECYSTECTOMY     EYE SURGERY     Cataracts   TUBAL LIGATION      ROS: Review of  Systems Negative except as stated above  PHYSICAL EXAM: BP 126/72   Pulse 72   Temp 98.5 F (36.9 C) (Oral)   Ht 5' 8.5" (1.74 m)   Wt 239 lb 3.2 oz (108.5 kg)   SpO2 98%   BMI 35.84 kg/m   Physical Exam HENT:     Head: Normocephalic and atraumatic.     Right Ear: Tympanic membrane, ear canal and external ear normal.     Left Ear: Tympanic membrane, ear canal and external ear normal.     Nose: Nose normal.     Mouth/Throat:     Mouth: Mucous membranes are moist.     Pharynx: Oropharynx is clear.  Eyes:     Extraocular Movements: Extraocular movements intact.     Conjunctiva/sclera: Conjunctivae normal.     Pupils: Pupils are equal, round, and reactive to light.  Cardiovascular:     Rate and Rhythm: Normal rate and regular rhythm.     Pulses: Normal pulses.     Heart sounds: Normal heart sounds.  Pulmonary:     Effort: Pulmonary effort is normal.     Breath sounds: Normal breath sounds.  Musculoskeletal:        General: Normal range of motion.     Cervical back: Normal range of motion and neck supple.  Neurological:     General: No focal deficit present.     Mental Status: She is alert and oriented to person, place, and time.  Psychiatric:        Mood and Affect: Mood normal.        Behavior: Behavior normal.     ASSESSMENT AND PLAN: 1. Earache on left - Amoxicillin-Clavulanate as prescribed. Counseled on medication adherence/adverse effects. - Follow-up with primary provider as scheduled.  - amoxicillin-clavulanate (AUGMENTIN) 875-125 MG tablet; Take 1 tablet by mouth 2 (two) times daily.  Dispense: 20 tablet; Refill: 0    Patient was given the opportunity to ask questions.  Patient verbalized understanding of the plan and was able to repeat key elements of the plan. Patient was given clear instructions to go to Emergency Department or return to medical center if symptoms don't improve, worsen, or new problems develop.The patient verbalized  understanding.   Requested Prescriptions   Signed Prescriptions Disp Refills   amoxicillin-clavulanate (AUGMENTIN) 875-125 MG tablet 20 tablet 0    Sig: Take 1 tablet by mouth 2 (two) times daily.    Follow-up with primary provider as scheduled.   Rema Fendt, NP

## 2023-07-10 ENCOUNTER — Ambulatory Visit (INDEPENDENT_AMBULATORY_CARE_PROVIDER_SITE_OTHER): Payer: PPO | Admitting: Family

## 2023-07-10 VITALS — BP 129/78 | HR 69 | Temp 98.0°F | Ht 68.75 in | Wt 242.2 lb

## 2023-07-10 DIAGNOSIS — Z130 Encounter for screening for diseases of the blood and blood-forming organs and certain disorders involving the immune mechanism: Secondary | ICD-10-CM

## 2023-07-10 DIAGNOSIS — Z1329 Encounter for screening for other suspected endocrine disorder: Secondary | ICD-10-CM

## 2023-07-10 DIAGNOSIS — Z13228 Encounter for screening for other metabolic disorders: Secondary | ICD-10-CM

## 2023-07-10 DIAGNOSIS — Z13220 Encounter for screening for lipoid disorders: Secondary | ICD-10-CM

## 2023-07-10 DIAGNOSIS — Z131 Encounter for screening for diabetes mellitus: Secondary | ICD-10-CM

## 2023-07-10 DIAGNOSIS — Z122 Encounter for screening for malignant neoplasm of respiratory organs: Secondary | ICD-10-CM

## 2023-07-10 DIAGNOSIS — Z0000 Encounter for general adult medical examination without abnormal findings: Secondary | ICD-10-CM

## 2023-07-10 DIAGNOSIS — Z1211 Encounter for screening for malignant neoplasm of colon: Secondary | ICD-10-CM

## 2023-07-10 DIAGNOSIS — Z1231 Encounter for screening mammogram for malignant neoplasm of breast: Secondary | ICD-10-CM

## 2023-07-10 NOTE — Progress Notes (Signed)
 Patient ID: Rebecca Mata, female    DOB: 06-03-1955  MRN: [id number]  CC: Annual Exam  Subjective: Rebecca Mata is a 68 y.o. female who presents for annual exam.  Her concerns today include:  - Reports weight gain. States she is unsure if her health insurance covers cost of preferred weight loss injections. She declines referral to weight specialist.  Patient Active Problem List   Diagnosis Date Noted   Left otitis media 04/05/2021   Vitamin D deficiency 09/20/2020   Hyperlipidemia 09/20/2020   Prediabetes 09/18/2020     Current Outpatient Medications on File Prior to Visit  Medication Sig Dispense Refill   Cholecalciferol (VITAMIN D3) 1.25 MG (50000 UT) TABS TAKE 1 TABLET BY MOUTH EVERY 7 DAYS FOR 12 DOSES 12 tablet o   amoxicillin-clavulanate (AUGMENTIN) 875-125 MG tablet Take 1 tablet by mouth 2 (two) times daily. (Patient not taking: Reported on 07/10/2023) 20 tablet 0   atorvastatin (LIPITOR) 40 MG tablet Take 1 tablet (40 mg total) by mouth daily. (Patient not taking: Reported on 07/10/2023) 90 tablet 0   buPROPion (WELLBUTRIN SR) 150 MG 12 hr tablet TAKE 1 TABLET (150 MG TOTAL) BY MOUTH TWICE (2 TIMES) DAILY (Patient not taking: Reported on 07/10/2023) 60 tablet 2   Cholecalciferol (VITAMIN D3) 125 MCG (5000 UT) CAPS TAKE 1 TABLET BY MOUTH EVERY 7 (SEVEN) DAYS FOR 12 DOSES. (Patient not taking: Reported on 07/10/2023)     No current facility-administered medications on file prior to visit.    Allergies  Allergen Reactions   Codeine     Social History   Socioeconomic History   Marital status: Widowed    Spouse name: Not on file   Number of children: 2   Years of education: Not on file   Highest education level: Not on file  Occupational History   Not on file  Tobacco Use   Smoking status: Every Day    Current packs/day: 0.50    Average packs/day: 0.5 packs/day for 42.0 years (21.0 ttl pk-yrs)    Types: Cigarettes   Smokeless tobacco: Never  Vaping Use   Vaping  status: Never Used  Substance and Sexual Activity   Alcohol use: Not Currently   Drug use: Never   Sexual activity: Not Currently    Birth control/protection: Post-menopausal  Other Topics Concern   Not on file  Social History Narrative   Not on file   Social Drivers of Health   Financial Resource Strain: Low Risk  (02/15/2023)   Overall Financial Resource Strain (CARDIA)    Difficulty of Paying Living Expenses: Not hard at all  Food Insecurity: No Food Insecurity (02/15/2023)   Hunger Vital Sign    Worried About Running Out of Food in the Last Year: Never true    Ran Out of Food in the Last Year: Never true  Transportation Needs: No Transportation Needs (02/15/2023)   PRAPARE - Administrator, Civil Service (Medical): No    Lack of Transportation (Non-Medical): No  Physical Activity: Sufficiently Active (02/15/2023)   Exercise Vital Sign    Days of Exercise per Week: 3 days    Minutes of Exercise per Session: 90 min  Stress: No Stress Concern Present (02/15/2023)   [employer] of Occupational Health - Occupational Stress Questionnaire    Feeling of Stress : Not at all  Social Connections: Moderately Isolated (02/15/2023)   Social Connection and Isolation Panel [NHANES]    Frequency of Communication with Friends and Family:  More than three times a week    Frequency of Social Gatherings with Friends and Family: More than three times a week    Attends Religious Services: More than 4 times per year    Active Member of Clubs or Organizations: No    Attends Banker Meetings: Never    Marital Status: Widowed  Intimate Partner Violence: Not At Risk (02/15/2023)   Humiliation, Afraid, Rape, and Kick questionnaire    Fear of Current or Ex-Partner: No    Emotionally Abused: No    Physically Abused: No    Sexually Abused: No    Family History  Problem Relation Age of Onset   Heart disease Mother    Stroke Mother    Diabetes Mother    Brain cancer Father     Diabetes Sister     Past Surgical History:  Procedure Laterality Date   CHOLECYSTECTOMY     EYE SURGERY     Cataracts   TUBAL LIGATION      ROS: Review of Systems Negative except as stated above  PHYSICAL EXAM: BP 129/78   Pulse 69   Temp 98 F (36.7 C) (Oral)   Ht 5' 8.75" (1.746 m)   Wt 242 lb 3.2 oz (109.9 kg)   SpO2 96%   BMI 36.03 kg/m   Physical Exam HENT:     Head: Normocephalic and atraumatic.     Right Ear: Tympanic membrane, ear canal and external ear normal.     Left Ear: Tympanic membrane, ear canal and external ear normal.     Nose: Nose normal.     Mouth/Throat:     Mouth: Mucous membranes are moist.     Pharynx: Oropharynx is clear.  Eyes:     Extraocular Movements: Extraocular movements intact.     Conjunctiva/sclera: Conjunctivae normal.     Pupils: Pupils are equal, round, and reactive to light.  Neck:     Thyroid: No thyroid mass, thyromegaly or thyroid tenderness.  Cardiovascular:     Rate and Rhythm: Normal rate and regular rhythm.     Pulses: Normal pulses.     Heart sounds: Normal heart sounds.  Pulmonary:     Effort: Pulmonary effort is normal.     Breath sounds: Normal breath sounds.  Chest:     Comments: Patient declined. Abdominal:     General: Bowel sounds are normal.     Palpations: Abdomen is soft.  Genitourinary:    Comments: Patient declined. Musculoskeletal:        General: Normal range of motion.     Right shoulder: Normal.     Left shoulder: Normal.     Right upper arm: Normal.     Left upper arm: Normal.     Right elbow: Normal.     Left elbow: Normal.     Right forearm: Normal.     Left forearm: Normal.     Right wrist: Normal.     Left wrist: Normal.     Right hand: Normal.     Left hand: Normal.     Cervical back: Normal, normal range of motion and neck supple.     Thoracic back: Normal.     Lumbar back: Normal.     Right hip: Normal.     Left hip: Normal.     Right upper leg: Normal.     Left upper  leg: Normal.     Right knee: Normal.     Left knee: Normal.  Right lower leg: Normal.     Left lower leg: Normal.     Right ankle: Normal.     Left ankle: Normal.     Right foot: Normal.     Left foot: Normal.  Skin:    General: Skin is warm and dry.     Capillary Refill: Capillary refill takes less than 2 seconds.  Neurological:     General: No focal deficit present.     Mental Status: She is alert and oriented to person, place, and time.  Psychiatric:        Mood and Affect: Mood normal.        Behavior: Behavior normal.     ASSESSMENT AND PLAN: 1. Annual physical exam (Primary) - Counseled on 150 minutes of exercise per week as tolerated, healthy eating (including decreased daily intake of saturated fats, cholesterol, added sugars, sodium), STI prevention, and routine healthcare maintenance.  2. Screening for metabolic disorder - Routine screening.  - CMP14+EGFR  3. Screening for deficiency anemia - Routine screening.  - CBC  4. Diabetes mellitus screening - Routine screening.  - Hemoglobin A1c  5. Screening cholesterol level - Routine screening.  - Lipid panel  6. Thyroid disorder screen - Routine screening.  - TSH  7. Encounter for screening mammogram for malignant neoplasm of breast - Routine screening.  - MM Digital Screening; Future  8. Encounter for screening for lung cancer - Routine screening.  - CT CHEST LUNG CA SCREEN LOW DOSE W/O CM; Future  9. Colon cancer screening - Referral to Gastroenterology for colon cancer screening by colonoscopy. - Ambulatory referral to Gastroenterology    Patient was given the opportunity to ask questions.  Patient verbalized understanding of the plan and was able to repeat key elements of the plan. Patient was given clear instructions to go to Emergency Department or return to medical center if symptoms don't improve, worsen, or new problems develop.The patient verbalized understanding.   Orders Placed This  Encounter  Procedures   MM Digital Screening   CT CHEST LUNG CA SCREEN LOW DOSE W/O CM   CBC   Lipid panel   CMP14+EGFR   Hemoglobin A1c   TSH   Ambulatory referral to Gastroenterology   Return in about 1 year (around 07/09/2024) for Physical per patient preference.  Rema Fendt, NP

## 2023-07-10 NOTE — Progress Notes (Signed)
 Patient states weight gain.

## 2023-07-11 ENCOUNTER — Encounter: Payer: Self-pay | Admitting: Family

## 2023-07-11 ENCOUNTER — Other Ambulatory Visit: Payer: Self-pay | Admitting: Family

## 2023-07-11 DIAGNOSIS — E785 Hyperlipidemia, unspecified: Secondary | ICD-10-CM

## 2023-07-11 LAB — CMP14+EGFR
ALT: 10 IU/L (ref 0–32)
AST: 21 IU/L (ref 0–40)
Albumin: 4.4 g/dL (ref 3.9–4.9)
Alkaline Phosphatase: 85 IU/L (ref 44–121)
BUN/Creatinine Ratio: 13 (ref 12–28)
BUN: 11 mg/dL (ref 8–27)
Bilirubin Total: 0.3 mg/dL (ref 0.0–1.2)
CO2: 22 mmol/L (ref 20–29)
Calcium: 9.7 mg/dL (ref 8.7–10.3)
Chloride: 106 mmol/L (ref 96–106)
Creatinine, Ser: 0.87 mg/dL (ref 0.57–1.00)
Globulin, Total: 3 g/dL (ref 1.5–4.5)
Glucose: 102 mg/dL — ABNORMAL HIGH (ref 70–99)
Potassium: 4.4 mmol/L (ref 3.5–5.2)
Sodium: 142 mmol/L (ref 134–144)
Total Protein: 7.4 g/dL (ref 6.0–8.5)
eGFR: 73 mL/min/{1.73_m2} (ref >59)

## 2023-07-11 LAB — LIPID PANEL
Chol/HDL Ratio: 6.7 ratio — ABNORMAL HIGH (ref 0.0–4.4)
Cholesterol, Total: 229 mg/dL — ABNORMAL HIGH (ref 100–199)
HDL: 34 mg/dL — ABNORMAL LOW (ref >39)
LDL Chol Calc (NIH): 159 mg/dL — ABNORMAL HIGH (ref 0–99)
Triglycerides: 194 mg/dL — ABNORMAL HIGH (ref 0–149)
VLDL Cholesterol Cal: 36 mg/dL (ref 5–40)

## 2023-07-11 LAB — CBC
Hematocrit: 40.2 % (ref 34.0–46.6)
Hemoglobin: 13.1 g/dL (ref 11.1–15.9)
MCH: 29.3 pg (ref 26.6–33.0)
MCHC: 32.6 g/dL (ref 31.5–35.7)
MCV: 90 fL (ref 79–97)
Platelets: 286 10*3/uL (ref 150–450)
RBC: 4.47 x10E6/uL (ref 3.77–5.28)
RDW: 14.6 % (ref 11.7–15.4)
WBC: 6.6 10*3/uL (ref 3.4–10.8)

## 2023-07-11 LAB — TSH: TSH: 1.66 u[IU]/mL (ref 0.450–4.500)

## 2023-07-11 LAB — HEMOGLOBIN A1C
Est. average glucose Bld gHb Est-mCnc: 131 mg/dL
Hgb A1c MFr Bld: 6.2 % — ABNORMAL HIGH (ref 4.8–5.6)

## 2023-07-11 MED ORDER — ATORVASTATIN CALCIUM 20 MG PO TABS: 20.0000 mg | ORAL_TABLET | Freq: Every day | ORAL | 0 refills | Status: DC

## 2023-10-06 ENCOUNTER — Other Ambulatory Visit: Payer: Self-pay | Admitting: Family

## 2023-10-06 DIAGNOSIS — E785 Hyperlipidemia, unspecified: Secondary | ICD-10-CM

## 2023-10-06 NOTE — Telephone Encounter (Signed)
 Complete

## 2023-11-16 ENCOUNTER — Ambulatory Visit: Admission: EM | Admit: 2023-11-16 | Discharge: 2023-11-16 | Disposition: A | Discharge status: Home / Self Care

## 2023-11-16 ENCOUNTER — Ambulatory Visit: Payer: Self-pay

## 2023-11-16 DIAGNOSIS — R252 Cramp and spasm: Secondary | ICD-10-CM | POA: Diagnosis not present

## 2023-11-16 NOTE — ED Notes (Signed)
 EMS at bedside. Report given.

## 2023-11-16 NOTE — Telephone Encounter (Signed)
 FYI Only or Action Required?: FYI only for provider.  Patient was last seen in primary care on 07/10/2023 by Lorren Greig PARAS, NP.  Called Nurse Triage reporting Back Pain.  Symptoms began several weeks ago.  Interventions attempted: Other: water, gatorade, pickle juice, vitamin D.  Symptoms are: gradually worsening.  Triage Disposition: See PCP When Office is Open (Within 3 Days)   Patient/caregiver understands and will follow disposition?:   Copied from CRM [id number]. Topic: Clinical - Red Word Triage >> Nov 16, 2023  1:43 PM [place] MATSU wrote: Red Word that prompted transfer to Nurse Triage: painful back spasm/cramps Reason for Disposition  [1] MODERATE pain (e.g., interferes with normal activities) AND [2] present > 3 days    Patient is concerned and wants to be evaluated in ED or UC today.  Answer Assessment - Initial Assessment Questions Patient hydrating with water and gatorade and has been taking Vitamin D. Patient stated that she considered going to the ED today, but is going to walk into the [facility] at [facility] in [place].     1. ONSET: When did the muscle aches or body pains start?      4 days, but has happened in the past 2. LOCATION: What part of your body is hurting? (e.g., entire body, arms, legs)      Entire body, intermittent 3. SEVERITY: How bad is the pain? (Scale 1-10; or mild, moderate, severe)     7-8 4. CAUSE: What do you think is causing the pains?     Unsure 5. FEVER: Do you have a fever? If Yes, ask: What is your temperature, how was it measured, and  when did it start?      No 6. OTHER SYMPTOMS: Do you have any other symptoms? (e.g., chest pain, cold or flu symptoms, rash, weakness, weight loss)     Nausea 4 days ago that resolved  Protocols used: Muscle Aches and Body Pain-A-AH

## 2023-11-16 NOTE — ED Notes (Signed)
 Patient is being discharged from the Urgent Care and sent to the Emergency Department via EMS (requested by patient) . Per Patient/Provider, patient is in need of higher level of care due to Pain. Patient is aware and verbalizes understanding of plan of care.  Vitals:   11/16/23 1633 11/16/23 1653  BP:  (!) 154/84  Pulse: 88 (!) 103  Resp: 20 20  Temp:  97.7 F (36.5 C)  SpO2: 98% 99%

## 2023-11-16 NOTE — ED Triage Notes (Signed)
 I always have cramps, but on Monday when I came out of [facility], the cramps/pain started happening more and they are intense, I feel like I am having trouble breathing at times due to this pain.   Patient requested EMS (Called).

## 2023-11-16 NOTE — ED Provider Notes (Signed)
 [facility]    CSN: [id number] Arrival date & time: 11/16/23  1625      History   Chief Complaint Chief Complaint  Patient presents with   Pain    HPI Rebecca Mata is a 68 y.o. female.   Patient presents today to urgent care for evaluation of cramps to her bilateral arms and abdomen that seem like they are worsening over the last several days.  She reports that she has had some mild cramps at baseline but these have worsened.  She reports that when she has cramps she has her breathing.  Before she is triaged she requests EMS transport.  The history is provided by the patient.    Past Medical History:  Diagnosis Date   Cataract    GERD (gastroesophageal reflux disease)    Hyperlipidemia     Patient Active Problem List   Diagnosis Date Noted   Left otitis media 04/05/2021   Vitamin D deficiency 09/20/2020   Hyperlipidemia 09/20/2020   Prediabetes 09/18/2020    Past Surgical History:  Procedure Laterality Date   CHOLECYSTECTOMY     EYE SURGERY     Cataracts   TUBAL LIGATION      OB History   No obstetric history on file.      Home Medications    Prior to Admission medications   Medication Sig Start Date End Date Taking? Authorizing Provider  amoxicillin -clavulanate (AUGMENTIN ) 875-125 MG tablet Take 1 tablet by mouth 2 (two) times daily. Patient not taking: Reported on 07/10/2023 03/28/23   Lorren Greig PARAS, NP  atorvastatin  (LIPITOR) 20 MG tablet TAKE 1 TABLET BY MOUTH EVERY DAY 10/06/23   Lorren Greig PARAS, NP  atorvastatin  (LIPITOR) 40 MG tablet Take 1 tablet (40 mg total) by mouth daily. Patient not taking: Reported on 01/13/2021 09/20/20   Lorren Greig PARAS, NP  buPROPion  (WELLBUTRIN  SR) 150 MG 12 hr tablet TAKE 1 TABLET (150 MG TOTAL) BY MOUTH TWICE (2 TIMES) DAILY Patient not taking: Reported on 02/15/2023 12/27/21   Lorren Greig PARAS, NP  Cholecalciferol  (VITAMIN D3) 1.25 MG (50000 UT) TABS TAKE 1 TABLET BY MOUTH EVERY 7 DAYS FOR 12 DOSES 12/25/20    Tanda Bleacher, MD  Cholecalciferol  (VITAMIN D3) 125 MCG (5000 UT) CAPS TAKE 1 TABLET BY MOUTH EVERY 7 (SEVEN) DAYS FOR 12 DOSES. Patient not taking: Reported on 04/21/2022 09/20/20   [provider]    Family History Family History  Problem Relation Age of Onset   Heart disease Mother    Stroke Mother    Diabetes Mother    Brain cancer Father    Diabetes Sister     Social History Social History   Tobacco Use   Smoking status: Every Day    Current packs/day: 0.50    Average packs/day: 0.5 packs/day for 42.0 years (21.0 ttl pk-yrs)    Types: Cigarettes   Smokeless tobacco: Never  Vaping Use   Vaping status: Never Used  Substance Use Topics   Alcohol use: Not Currently   Drug use: Never     Allergies   Codeine   Review of Systems Review of Systems  Constitutional:  Negative for chills and fever.  Eyes:  Negative for discharge and redness.  Respiratory:  Positive for shortness of breath.   Gastrointestinal:  Positive for abdominal pain. Negative for blood in stool, diarrhea, nausea and vomiting.  Musculoskeletal:  Positive for myalgias.     Physical Exam Triage Vital Signs ED Triage Vitals  Encounter  Vitals Group     BP 11/16/23 1653 (!) 154/84     Girls Systolic BP Percentile --      Girls Diastolic BP Percentile --      Boys Systolic BP Percentile --      Boys Diastolic BP Percentile --      Pulse Rate 11/16/23 1633 88     Resp 11/16/23 1633 20     Temp 11/16/23 1653 97.7 F (36.5 C)     Temp Source 11/16/23 1653 Oral     SpO2 11/16/23 1633 98 %     Weight 11/16/23 1657 242 lb 4.6 oz (109.9 kg)     Height 11/16/23 1657 5' 8.75 (1.746 m)     Head Circumference --      Peak Flow --      Pain Score 11/16/23 1656 7     Pain Loc --      Pain Education --      Exclude from Growth Chart --    No data found.  Updated Vital Signs BP (!) 154/84 (BP Location: Left Arm)   Pulse (!) 103   Temp 97.7 F (36.5 C) (Oral)   Resp 20   Ht 5' 8.75  (1.746 m)   Wt 242 lb 4.6 oz (109.9 kg)   SpO2 99%   BMI 36.04 kg/m   Visual Acuity Right Eye Distance:   Left Eye Distance:   Bilateral Distance:    Right Eye Near:   Left Eye Near:    Bilateral Near:     Physical Exam Vitals and nursing note reviewed.  Constitutional:      General: She is not in acute distress.    Appearance: Normal appearance. She is not ill-appearing.  HENT:     Head: Normocephalic and atraumatic.  Eyes:     Conjunctiva/sclera: Conjunctivae normal.  Cardiovascular:     Rate and Rhythm: Normal rate and regular rhythm.  Pulmonary:     Effort: Pulmonary effort is normal. No respiratory distress.     Breath sounds: No wheezing, rhonchi or rales.  Neurological:     Mental Status: She is alert.  Psychiatric:        Mood and Affect: Mood normal.        Behavior: Behavior normal.        Thought Content: Thought content normal.      UC Treatments / Results  Labs (all labs ordered are listed, but only abnormal results are displayed) Labs Reviewed - No data to display  EKG   Radiology No results found.  Procedures Procedures (including critical care time)  Medications Ordered in UC Medications - No data to display  Initial Impression / Assessment and Plan / UC Course  I have reviewed the triage vital signs and the nursing notes.  Pertinent labs & imaging results that were available during my care of the patient were reviewed by me and considered in my medical decision making (see chart for details).   EMS called at patient's request.  Transferred care to [facility] once they arrived.   Final Clinical Impressions(s) / UC Diagnoses   Final diagnoses:  Cramps, muscle, general   Discharge Instructions   None    ED Prescriptions   None    PDMP not reviewed this encounter.   Billy Asberry FALCON, PA-C 11/16/23 1901

## 2023-11-16 NOTE — ED Triage Notes (Signed)
 Spoke with patient in waiting area while waiting on room. She is in no immediate distress. I feel like I don't breath well because of my pain.

## 2023-11-17 NOTE — Telephone Encounter (Signed)
 I called patient back and she call 911.  Patient was dehydrated and she stated she feels much better,  She is going to call back at 1pm to schedule a lab appointment

## 2023-11-17 NOTE — Telephone Encounter (Signed)
 Report to Emergency Department/Urgent Care/call 911 for immediate medical evaluation. Follow-up with Primary Care.

## 2024-02-26 ENCOUNTER — Ambulatory Visit (INDEPENDENT_AMBULATORY_CARE_PROVIDER_SITE_OTHER)

## 2024-02-26 VITALS — BP 111/62 | HR 70 | Ht 68.75 in | Wt 242.0 lb

## 2024-02-26 DIAGNOSIS — Z0000 Encounter for general adult medical examination without abnormal findings: Secondary | ICD-10-CM | POA: Diagnosis not present

## 2024-02-26 NOTE — Patient Instructions (Signed)
 Rebecca Mata,  Thank you for taking the time for your Medicare Wellness Visit. I appreciate your continued commitment to your health goals. Please review the care plan we discussed, and feel free to reach out if I can assist you further.  Medicare recommends these wellness visits once per year to help you and your care team stay ahead of potential health issues. These visits are designed to focus on prevention, allowing your provider to concentrate on managing your acute and chronic conditions during your regular appointments.  Please note that Annual Wellness Visits do not include a physical exam. Some assessments may be limited, especially if the visit was conducted virtually. If needed, we may recommend a separate in-person follow-up with your provider.  Ongoing Care Seeing your primary care provider every 3 to 6 months helps us  monitor your health and provide consistent, personalized care. Last office visit on 07/10/2023.  Remember to call office and schedule a follow up appointment with Rebecca Mata.  Aim for 30 minutes of exercise or brisk walking, 6-8 glasses of water, and 5 servings of fruits and vegetables each day.   Referrals If a referral was made during today's visit and you haven't received any updates within two weeks, please contact the referred provider directly to check on the status.  Recommended Screenings:  Health Maintenance  Topic Date Due   DTaP/Tdap/Td vaccine (1 - Tdap) Never done   Pneumococcal Vaccine for age over 34 (1 of 2 - PCV) Never done   Colon Cancer Screening  Never done   Screening for Lung Cancer  Never done   Zoster (Shingles) Vaccine (1 of 2) Never done   Breast Cancer Screening  11/27/2022   Flu Shot  Never done   COVID-19 Vaccine (1 - 2025-26 season) Never done   Medicare Annual Wellness Visit  02/25/2025   DEXA scan (bone density measurement)  Completed   Hepatitis C Screening  Completed   Meningitis B Vaccine  Aged Out       02/26/2024   10:25 AM   Advanced Directives  Does Patient Have a Medical Advance Directive? Yes  Type of Advance Directive Living will   Advance Care Planning is important because it: Ensures you receive medical care that aligns with your values, goals, and preferences. Provides guidance to your family and loved ones, reducing the emotional burden of decision-making during critical moments.  Vision: Annual vision screenings are recommended for early detection of glaucoma, cataracts, and diabetic retinopathy. These exams can also reveal signs of chronic conditions such as diabetes and high blood pressure.  Dental: Annual dental screenings help detect early signs of oral cancer, gum disease, and other conditions linked to overall health, including heart disease and diabetes.  Please see the attached documents for additional preventive care recommendations.

## 2024-02-26 NOTE — Progress Notes (Signed)
 Subjective:   Rebecca Mata is a 68 y.o. who presents for a Medicare Wellness preventive visit.  As a reminder, Annual Wellness Visits don't include a physical exam, and some assessments may be limited, especially if this visit is performed virtually. We may recommend an in-person follow-up visit with your provider if needed.  Visit Complete: Virtual I connected with  Rebecca Mata on 02/26/24 by a audio enabled telemedicine application and verified that I am speaking with the correct person using two identifiers.  Patient Location: Home  Provider Location: Home Office  I discussed the limitations of evaluation and management by telemedicine. The patient expressed understanding and agreed to proceed.  Vital Signs: Because this visit was a virtual/telehealth visit, some criteria may be missing or patient reported. Any vitals not documented were not able to be obtained and vitals that have been documented are patient reported.  VideoDeclined- This patient declined Librarian, academic. Therefore the visit was completed with audio only.  Persons Participating in Visit: Patient.  AWV Questionnaire: No: Patient Medicare AWV questionnaire was not completed prior to this visit.  Cardiac Risk Factors include: advanced age (>88men, >68 women);dyslipidemia     Objective:    Today's Vitals   02/26/24 1019  Weight: 242 lb (109.8 kg)  Height: 5' 8.75 (1.746 m)   Body mass index is 36 kg/m.     02/26/2024   10:25 AM 02/15/2023    4:35 PM 04/21/2022    2:04 PM  Advanced Directives  Does Patient Have a Medical Advance Directive? Yes No No  Type of Advance Directive Living will      Current Medications (verified) Outpatient Encounter Medications as of 02/26/2024  Medication Sig   Red Yeast Rice Extract (RED YEAST RICE PO) Take by mouth at bedtime.   amoxicillin -clavulanate (AUGMENTIN ) 875-125 MG tablet Take 1 tablet by mouth 2 (two) times daily.  (Patient not taking: Reported on 02/26/2024)   atorvastatin  (LIPITOR) 20 MG tablet TAKE 1 TABLET BY MOUTH EVERY DAY (Patient not taking: Reported on 02/26/2024)   atorvastatin  (LIPITOR) 40 MG tablet Take 1 tablet (40 mg total) by mouth daily. (Patient not taking: Reported on 02/26/2024)   buPROPion  (WELLBUTRIN  SR) 150 MG 12 hr tablet TAKE 1 TABLET (150 MG TOTAL) BY MOUTH TWICE (2 TIMES) DAILY (Patient not taking: Reported on 02/26/2024)   Cholecalciferol  (VITAMIN D3) 1.25 MG (50000 UT) TABS TAKE 1 TABLET BY MOUTH EVERY 7 DAYS FOR 12 DOSES   Cholecalciferol  (VITAMIN D3) 125 MCG (5000 UT) CAPS TAKE 1 TABLET BY MOUTH EVERY 7 (SEVEN) DAYS FOR 12 DOSES. (Patient not taking: Reported on 02/26/2024)   No facility-administered encounter medications on file as of 02/26/2024.    Allergies (verified) Codeine   History: Past Medical History:  Diagnosis Date   Cataract    GERD (gastroesophageal reflux disease)    Hyperlipidemia    Past Surgical History:  Procedure Laterality Date   CHOLECYSTECTOMY     EYE SURGERY     Cataracts   TUBAL LIGATION     Family History  Problem Relation Age of Onset   Heart disease Mother    Stroke Mother    Diabetes Mother    Brain cancer Father    Diabetes Sister    Social History   Socioeconomic History   Marital status: Widowed    Spouse name: Not on file   Number of children: 2   Years of education: Not on file   Highest education level: Not  on file  Occupational History   Occupation: RETIRED  Tobacco Use   Smoking status: Every Day    Current packs/day: 0.50    Average packs/day: 0.5 packs/day for 42.0 years (21.0 ttl pk-yrs)    Types: Cigarettes   Smokeless tobacco: Never  Vaping Use   Vaping status: Never Used  Substance and Sexual Activity   Alcohol use: Not Currently   Drug use: Never   Sexual activity: Not Currently    Birth control/protection: Post-menopausal  Other Topics Concern   Not on file  Social History Narrative   Lives  alone/*2025   Social Drivers of Health   Financial Resource Strain: Low Risk  (02/26/2024)   Overall Financial Resource Strain (CARDIA)    Difficulty of Paying Living Expenses: Not hard at all  Food Insecurity: No Food Insecurity (02/26/2024)   Hunger Vital Sign    Worried About Running Out of Food in the Last Year: Never true    Ran Out of Food in the Last Year: Never true  Transportation Needs: No Transportation Needs (02/26/2024)   PRAPARE - Administrator, Civil Service (Medical): No    Lack of Transportation (Non-Medical): No  Physical Activity: Inactive (02/26/2024)   Exercise Vital Sign    Days of Exercise per Week: 0 days    Minutes of Exercise per Session: 0 min  Stress: No Stress Concern Present (02/26/2024)   [employer] of Occupational Health - Occupational Stress Questionnaire    Feeling of Stress: Not at all  Social Connections: Moderately Integrated (02/26/2024)   Social Connection and Isolation Panel    Frequency of Communication with Friends and Family: More than three times a week    Frequency of Social Gatherings with Friends and Family: More than three times a week    Attends Religious Services: More than 4 times per year    Active Member of [employer] or Organizations: Yes    Attends Banker Meetings: More than 4 times per year    Marital Status: Widowed    Tobacco Counseling Ready to quit: Not Answered Counseling given: Not Answered    Clinical Intake:  Pre-visit preparation completed: Yes  Pain : No/denies pain     BMI - recorded: 36 Nutritional Status: BMI > 30  Obese Nutritional Risks: None Diabetes: No  Lab Results  Component Value Date   HGBA1C 6.2 (H) 07/10/2023   HGBA1C 6.0 (A) 03/22/2021   HGBA1C 6.1 (A) 09/18/2020     How often do you need to have someone help you when you read instructions, pamphlets, or other written materials from your doctor or pharmacy?: 1 - Never  Interpreter Needed?:  No  Information entered by :: Nalanie Winiecki <RMA   Activities of Daily Living     02/26/2024   10:21 AM  In your present state of health, do you have any difficulty performing the following activities:  Hearing? 0  Vision? 0  Difficulty concentrating or making decisions? 0  Walking or climbing stairs? 0  Dressing or bathing? 0  Doing errands, shopping? 0  Preparing Food and eating ? N  Using the Toilet? N  In the past six months, have you accidently leaked urine? N  Do you have problems with loss of bowel control? N  Managing your Medications? N  Managing your Finances? N  Housekeeping or managing your Housekeeping? N    Patient Care Team: Lorren Greig PARAS, NP as PCP - General (Nurse Practitioner)  I have updated your  Care Teams any recent Medical Services you may have received from other providers in the past year.     Assessment:   This is a routine wellness examination for Rebecca Mata.  Hearing/Vision screen Hearing Screening - Comments:: Denies hearing difficulties   Vision Screening - Comments:: Had cataract surgery/Dr. Octavia   Goals Addressed   None    Depression Screen     02/26/2024   10:32 AM 03/28/2023   10:32 AM 02/15/2023    4:37 PM 04/21/2022    2:05 PM 11/30/2021    3:56 PM 07/13/2021   11:03 AM 03/22/2021   10:25 AM  PHQ 2/9 Scores  PHQ - 2 Score 0 1 0 0 0 0 0  PHQ- 9 Score 0          Fall Risk     02/26/2024   10:27 AM 07/10/2023   10:10 AM 03/28/2023   10:32 AM 02/14/2023    1:17 PM 04/21/2022    2:05 PM  Fall Risk   Falls in the past year? 0 0 0 0 0  Number falls in past yr: 0 0 0 0 0  Injury with Fall? 0 0 0 0 0  Risk for fall due to :  No Fall Risks No Fall Risks No Fall Risks Medication side effect  Follow up Falls evaluation completed;Falls prevention discussed Falls evaluation completed  Falls evaluation completed;Falls prevention discussed Falls prevention discussed;Education provided;Falls evaluation completed      Data saved with a  previous flowsheet row definition    MEDICARE RISK AT HOME:  Medicare Risk at Home Any stairs in or around the home?: Yes (outside steps front/side) If so, are there any without handrails?: No Home free of loose throw rugs in walkways, pet beds, electrical cords, etc?: Yes Adequate lighting in your home to reduce risk of falls?: Yes Life alert?: No Use of a cane, walker or w/c?: No Grab bars in the bathroom?: Yes Shower chair or bench in shower?: Yes Elevated toilet seat or a handicapped toilet?: Yes  TIMED UP AND GO:  Was the test performed?  No  Cognitive Function: Declined/Normal: No cognitive concerns noted by patient or family. Patient alert, oriented, able to answer questions appropriately and recall recent events. No signs of memory loss or confusion.        02/15/2023    4:38 PM 04/21/2022    2:07 PM  6CIT Screen  What Year? 0 points 0 points  What month? 0 points 0 points  What time? 0 points 0 points  Count back from 20 0 points 0 points  Months in reverse 0 points 0 points  Repeat phrase 0 points 2 points  Total Score 0 points 2 points    Immunizations  There is no immunization history on file for this patient.  Screening Tests Health Maintenance  Topic Date Due   DTaP/Tdap/Td (1 - Tdap) Never done   Pneumococcal Vaccine: 50+ Years (1 of 2 - PCV) Never done   Colonoscopy  Never done   Lung Cancer Screening  Never done   Zoster Vaccines- Shingrix (1 of 2) Never done   Mammogram  11/27/2022   Influenza Vaccine  Never done   COVID-19 Vaccine (1 - 2025-26 season) Never done   Medicare Annual Wellness (AWV)  02/25/2025   DEXA SCAN  Completed   Hepatitis C Screening  Completed   Meningococcal B Vaccine  Aged Out    Health Maintenance Items Addressed: See Nurse Notes at the end of  this note  Additional Screening:  Vision Screening: Recommended annual ophthalmology exams for early detection of glaucoma and other disorders of the eye. Is the patient up  to date with their annual eye exam?  No  Who is the provider or what is the name of the office in which the patient attends annual eye exams? [facility]/ Patient not up to date.  Dental Screening: Recommended annual dental exams for proper oral hygiene  Community Resource Referral / Chronic Care Management: CRR required this visit?  No   CCM required this visit?  No   Plan:    I have personally reviewed and noted the following in the patient's chart:   Medical and social history Use of alcohol, tobacco or illicit drugs  Current medications and supplements including opioid prescriptions. Patient is not currently taking opioid prescriptions. Functional ability and status Nutritional status Physical activity Advanced directives List of other physicians Hospitalizations, surgeries, and ER visits in previous 12 months Vitals Screenings to include cognitive, depression, and falls Referrals and appointments  In addition, I have reviewed and discussed with patient certain preventive protocols, quality metrics, and best practice recommendations. A written personalized care plan for preventive services as well as general preventive health recommendations were provided to patient.   [place] Svehla L Shoshana Johal, CMA   02/26/2024   After Visit Summary: (MyChart) Due to this being a telephonic visit, the after visit summary with patients personalized plan was offered to patient via MyChart   Notes: Patient declines a colonoscopy and all vaccines except Covid.  She has not been scheduled for her mammogram or the lung cancer screening.  Patient stated that she will call the office to schedule a visit for blood work, as she has stopped taking the atorvastatin  (LIPITOR), and is now taking Red Yeast rice.
